# Patient Record
Sex: Male | Born: 1980 | Race: Black or African American | Hispanic: No | Marital: Married | Smoking: Never smoker
Health system: Southern US, Community
[De-identification: ages and names within clinical notes are randomized; demographics above are authoritative.]

## PROBLEM LIST (undated history)

## (undated) DIAGNOSIS — G43909 Migraine, unspecified, not intractable, without status migrainosus: Secondary | ICD-10-CM

---

## 2016-12-12 ENCOUNTER — Emergency Department (HOSPITAL_COMMUNITY)
Admission: EM | Admit: 2016-12-12 | Discharge: 2016-12-13 | Disposition: A | Discharge status: Home / Self Care | Payer: Self-pay | Attending: Physician Assistant | Admitting: Physician Assistant

## 2016-12-12 ENCOUNTER — Emergency Department (HOSPITAL_COMMUNITY): Payer: Self-pay

## 2016-12-12 ENCOUNTER — Encounter (HOSPITAL_COMMUNITY): Payer: Self-pay

## 2016-12-12 DIAGNOSIS — R404 Transient alteration of awareness: Secondary | ICD-10-CM | POA: Insufficient documentation

## 2016-12-12 DIAGNOSIS — Y999 Unspecified external cause status: Secondary | ICD-10-CM | POA: Insufficient documentation

## 2016-12-12 DIAGNOSIS — Z79899 Other long term (current) drug therapy: Secondary | ICD-10-CM | POA: Insufficient documentation

## 2016-12-12 DIAGNOSIS — Y929 Unspecified place or not applicable: Secondary | ICD-10-CM | POA: Insufficient documentation

## 2016-12-12 DIAGNOSIS — Z5181 Encounter for therapeutic drug level monitoring: Secondary | ICD-10-CM | POA: Insufficient documentation

## 2016-12-12 DIAGNOSIS — Y939 Activity, unspecified: Secondary | ICD-10-CM | POA: Insufficient documentation

## 2016-12-12 DIAGNOSIS — S01511A Laceration without foreign body of lip, initial encounter: Secondary | ICD-10-CM | POA: Insufficient documentation

## 2016-12-12 DIAGNOSIS — Z23 Encounter for immunization: Secondary | ICD-10-CM | POA: Insufficient documentation

## 2016-12-12 LAB — TYPE AND SCREEN
ABO/RH(D): O POS
Antibody Screen: NEGATIVE

## 2016-12-12 LAB — URINALYSIS, ROUTINE W REFLEX MICROSCOPIC
BACTERIA UA: NONE SEEN
Bilirubin Urine: NEGATIVE
GLUCOSE, UA: NEGATIVE mg/dL
Hgb urine dipstick: NEGATIVE
Ketones, ur: 5 mg/dL — AB
Leukocytes, UA: NEGATIVE
NITRITE: NEGATIVE
Protein, ur: 30 mg/dL — AB
SPECIFIC GRAVITY, URINE: 1.028 (ref 1.005–1.030)
pH: 8 (ref 5.0–8.0)

## 2016-12-12 LAB — CBC
HEMATOCRIT: 38.8 % — AB (ref 39.0–52.0)
Hemoglobin: 12.9 g/dL — ABNORMAL LOW (ref 13.0–17.0)
MCH: 26.4 pg (ref 26.0–34.0)
MCHC: 33.2 g/dL (ref 30.0–36.0)
MCV: 79.5 fL (ref 78.0–100.0)
PLATELETS: 225 10*3/uL (ref 150–400)
RBC: 4.88 MIL/uL (ref 4.22–5.81)
RDW: 13.5 % (ref 11.5–15.5)
WBC: 8.7 10*3/uL (ref 4.0–10.5)

## 2016-12-12 LAB — COMPREHENSIVE METABOLIC PANEL
ALBUMIN: 3.5 g/dL (ref 3.5–5.0)
ALK PHOS: 90 U/L (ref 38–126)
ALT: 18 U/L (ref 17–63)
AST: 22 U/L (ref 15–41)
Anion gap: 9 (ref 5–15)
BILIRUBIN TOTAL: 0.7 mg/dL (ref 0.3–1.2)
BUN: 8 mg/dL (ref 6–20)
CO2: 25 mmol/L (ref 22–32)
CREATININE: 0.92 mg/dL (ref 0.61–1.24)
Calcium: 8.8 mg/dL — ABNORMAL LOW (ref 8.9–10.3)
Chloride: 104 mmol/L (ref 101–111)
GFR calc Af Amer: >60 mL/min (ref >60)
GLUCOSE: 129 mg/dL — AB (ref 65–99)
POTASSIUM: 3.2 mmol/L — AB (ref 3.5–5.1)
Sodium: 138 mmol/L (ref 135–145)
TOTAL PROTEIN: 7.2 g/dL (ref 6.5–8.1)

## 2016-12-12 LAB — I-STAT CHEM 8, ED
BUN: 11 mg/dL (ref 6–20)
CHLORIDE: 103 mmol/L (ref 101–111)
CREATININE: 0.8 mg/dL (ref 0.61–1.24)
Calcium, Ion: 1.04 mmol/L — ABNORMAL LOW (ref 1.15–1.40)
Glucose, Bld: 134 mg/dL — ABNORMAL HIGH (ref 65–99)
HEMATOCRIT: 41 % (ref 39.0–52.0)
Hemoglobin: 13.9 g/dL (ref 13.0–17.0)
POTASSIUM: 3.1 mmol/L — AB (ref 3.5–5.1)
Sodium: 139 mmol/L (ref 135–145)
TCO2: 24 mmol/L (ref 0–100)

## 2016-12-12 LAB — PROTIME-INR
INR: 1.15
PROTHROMBIN TIME: 14.8 s (ref 11.4–15.2)

## 2016-12-12 LAB — I-STAT CG4 LACTIC ACID, ED: LACTIC ACID, VENOUS: 2.42 mmol/L — AB (ref 0.5–1.9)

## 2016-12-12 LAB — ETHANOL: Alcohol, Ethyl (B): <5 mg/dL (ref <5)

## 2016-12-12 LAB — ABO/RH: ABO/RH(D): O POS

## 2016-12-12 LAB — CDS SEROLOGY

## 2016-12-12 MED ORDER — TETANUS-DIPHTH-ACELL PERTUSSIS 5-2.5-18.5 LF-MCG/0.5 IM SUSP
0.5000 mL | Freq: Once | INTRAMUSCULAR | Status: AC
  Administered 2016-12-12: 0.5 mL via INTRAMUSCULAR
  Filled 2016-12-12: qty 0.5

## 2016-12-12 NOTE — ED Notes (Signed)
Pt transported to CT ?

## 2016-12-12 NOTE — ED Notes (Signed)
Pt returned from CT °
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
## 2016-12-12 NOTE — ED Notes (Signed)
Pt given water and sprite and drinking without difficulty.
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
## 2016-12-12 NOTE — ED Notes (Signed)
Pt verbalized understanding of d/c instructions and has no further questions. Pt stable, NAD, VSS, pt alert and oriented x 4 upon d/c. Pt removed all belongings from room.

## 2016-12-12 NOTE — ED Notes (Signed)
Pt now oriented x 4 and alert to voice. Pt's significant other at bedside with pt. Pt resting.

## 2016-12-12 NOTE — ED Notes (Signed)
Pt ambulated to the end of the hall and back without difficulty.
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
## 2017-01-04 ENCOUNTER — Emergency Department (HOSPITAL_COMMUNITY)
Admission: EM | Admit: 2017-01-04 | Discharge: 2017-01-04 | Disposition: A | Discharge status: Home / Self Care | Payer: Self-pay | Attending: Emergency Medicine | Admitting: Emergency Medicine

## 2017-01-04 DIAGNOSIS — M545 Low back pain: Secondary | ICD-10-CM | POA: Insufficient documentation

## 2017-01-04 DIAGNOSIS — Z5321 Procedure and treatment not carried out due to patient leaving prior to being seen by health care provider: Secondary | ICD-10-CM | POA: Insufficient documentation

## 2017-01-04 NOTE — ED Notes (Signed)
Pt not in lobby, assumed pt left

## 2017-01-04 NOTE — ED Triage Notes (Signed)
Pt c/o shooting pain on right lower back since earlier this week. Denies any recent hard physical exertion. Pain is worse when making certain movements.
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
## 2017-01-04 NOTE — ED Notes (Signed)
Pt not in lobby.  

## 2017-01-04 NOTE — ED Notes (Signed)
Pt called for room. No response, pt not seen in lobby.

## 2017-05-03 ENCOUNTER — Encounter (HOSPITAL_COMMUNITY): Payer: Self-pay | Admitting: *Deleted

## 2017-05-03 ENCOUNTER — Emergency Department (HOSPITAL_COMMUNITY)
Admission: EM | Admit: 2017-05-03 | Discharge: 2017-05-03 | Disposition: A | Discharge status: Home / Self Care | Payer: BC Managed Care – PPO | Attending: Emergency Medicine | Admitting: Emergency Medicine

## 2017-05-03 DIAGNOSIS — G43109 Migraine with aura, not intractable, without status migrainosus: Secondary | ICD-10-CM | POA: Diagnosis not present

## 2017-05-03 DIAGNOSIS — R51 Headache: Secondary | ICD-10-CM | POA: Diagnosis present

## 2017-05-03 HISTORY — DX: Migraine, unspecified, not intractable, without status migrainosus: G43.909

## 2017-05-03 MED ORDER — SODIUM CHLORIDE 0.9 % IV BOLUS (SEPSIS)
1000.0000 mL | Freq: Once | INTRAVENOUS | Status: AC
  Administered 2017-05-03: 1000 mL via INTRAVENOUS

## 2017-05-03 MED ORDER — DEXAMETHASONE SODIUM PHOSPHATE 10 MG/ML IJ SOLN
10.0000 mg | Freq: Once | INTRAMUSCULAR | Status: AC
  Administered 2017-05-03: 10 mg via INTRAVENOUS
  Filled 2017-05-03: qty 1

## 2017-05-03 MED ORDER — DIPHENHYDRAMINE HCL 50 MG/ML IJ SOLN
12.5000 mg | Freq: Once | INTRAMUSCULAR | Status: AC
  Administered 2017-05-03: 12.5 mg via INTRAVENOUS
  Filled 2017-05-03: qty 1

## 2017-05-03 MED ORDER — PROCHLORPERAZINE EDISYLATE 5 MG/ML IJ SOLN
10.0000 mg | Freq: Once | INTRAMUSCULAR | Status: AC
  Administered 2017-05-03: 10 mg via INTRAVENOUS
  Filled 2017-05-03: qty 2

## 2017-05-03 NOTE — Discharge Instructions (Signed)
Please read attached information regarding your condition. Take ibuprofen or Tylenol as needed for headaches. Return to ED for worsening headache, vision changes, numbness, trouble walking, head injury.

## 2017-05-03 NOTE — ED Triage Notes (Signed)
Patient is alert and oriented x4.  He is being seen for a migraine behind the left eye that he states started a week ago.  Patient states that he has a Hx of migraines but has not had one in a long time.  Currently he rates his pain 6 of 10.

## 2017-05-03 NOTE — ED Provider Notes (Signed)
[facility]-EMERGENCY DEPT Provider Note   CSN: [id number] Arrival date & time: 05/03/17  0946     History   Chief Complaint Chief Complaint  Patient presents with  . Migraine    HPI Gavin Young is a 36 y.o. male.  HPI  Patient, with past medical history of migraines, presents to ED for evaluation of migraine that began one week ago. He reports some improvement in symptoms with Tylenol and Excedrin. States that pain is 6/10 and located on the left side of his head. He has had 1 episode of vomiting with intermittent nausea. He denies any vision changes, head injuries, falls, abdominal pain, numbness, weakness, trouble walking, fevers or chills.  Past Medical History:  Diagnosis Date  . Migraines     There are no active problems to display for this patient.   History reviewed. No pertinent surgical history.     Home Medications    Prior to Admission medications   Medication Sig Start Date End Date Taking? Authorizing Provider  BEE POLLEN PO Take 1 capsule by mouth at bedtime.    [provider]  CALCIUM PO Take 1 tablet by mouth at bedtime.    [provider]  Cholecalciferol (VITAMIN D PO) Take 1 tablet by mouth at bedtime.    [provider]    Family History History reviewed. No pertinent family history.  Social History Social History  Substance Use Topics  . Smoking status: Never Smoker  . Smokeless tobacco: Never Used  . Alcohol use No     Allergies   Patient has no known allergies.   Review of Systems Review of Systems  Constitutional: Negative for appetite change, chills and fever.  HENT: Negative for ear pain, rhinorrhea, sneezing and sore throat.   Eyes: Negative for photophobia and visual disturbance.  Respiratory: Negative for cough, chest tightness, shortness of breath and wheezing.   Cardiovascular: Negative for chest pain and palpitations.  Gastrointestinal: Positive for nausea and vomiting. Negative for abdominal  pain, blood in stool, constipation and diarrhea.  Genitourinary: Negative for dysuria, hematuria and urgency.  Musculoskeletal: Negative for myalgias.  Skin: Negative for rash.  Neurological: Positive for headaches. Negative for dizziness, weakness, light-headedness and numbness.     Physical Exam Updated Vital Signs BP 135/87 (BP Location: Right Arm)   Pulse (!) 56   Temp 97.7 F (36.5 C) (Oral)   Resp 18   Ht 6\' 3"  (1.905 m)   Wt 97.5 kg (215 lb)   SpO2 100%   BMI 26.87 kg/m   Physical Exam  Constitutional: He is oriented to person, place, and time. He appears well-developed and well-nourished. No distress.  Nontoxic appearing and in no acute distress.  HENT:  Head: Normocephalic and atraumatic.  Nose: Nose normal.  Eyes: Conjunctivae and EOM are normal. Right eye exhibits no discharge. Left eye exhibits no discharge. No scleral icterus.  Neck: Normal range of motion. Neck supple.  Cardiovascular: Normal rate, regular rhythm, normal heart sounds and intact distal pulses.  Exam reveals no gallop and no friction rub.   No murmur heard. Pulmonary/Chest: Effort normal and breath sounds normal. No respiratory distress.  Abdominal: Soft. Bowel sounds are normal. He exhibits no distension. There is no tenderness. There is no guarding.  Musculoskeletal: Normal range of motion. He exhibits no edema.  Neurological: He is alert and oriented to person, place, and time. No cranial nerve deficit or sensory deficit. He exhibits normal muscle tone. Coordination normal.  Pupils reactive. No facial asymmetry  noted. Cranial nerves appear grossly intact. Sensation intact to light touch on face, BUE and BLE. Strength 5/5 in BUE and BLE.  Skin: Skin is warm and dry. No rash noted.  Psychiatric: He has a normal mood and affect.  Nursing note and vitals reviewed.    ED Treatments / Results  Labs (all labs ordered are listed, but only abnormal results are displayed) Labs Reviewed - No data to  display  EKG  EKG Interpretation None       Radiology No results found.  Procedures Procedures (including critical care time)  Medications Ordered in ED Medications  dexamethasone (DECADRON) injection 10 mg (not administered)  sodium chloride 0.9 % bolus 1,000 mL (1,000 mLs Intravenous New Bag/Given 05/03/17 1342)  prochlorperazine (COMPAZINE) injection 10 mg (10 mg Intravenous Given 05/03/17 1342)  diphenhydrAMINE (BENADRYL) injection 12.5 mg (12.5 mg Intravenous Given 05/03/17 1342)     Initial Impression / Assessment and Plan / ED Course  I have reviewed the triage vital signs and the nursing notes.  Pertinent labs & imaging results that were available during my care of the patient were reviewed by me and considered in my medical decision making (see chart for details).     Patient presents to ED for evaluation of migraine. He does have a history of migraines in the past. States this feels similar. He denies any head injury, loss of consciousness, falls, vision changes. He has had some relief with Tylenol and Excedrin. There are no focal deficits on neurological exam. No head imaging warranted at this time, due to a low suspicion for acute intracranial abnormality or hemorrhage being the cause of his headache based on physical exam findings and history. Patient reports complete resolution of symptoms with Compazine, Benadryl and fluids. We'll give Decadron to prevent rebound. Patient ambulated throughout ED. High blood pressure improved in ED on recheck and medications. Encouraged patient to continue home medications as previously prescribed and to take Tylenol and Motrin as needed for headaches. Patient appears stable for discharge at this time. Strict return precautions given.  Final Clinical Impressions(s) / ED Diagnoses   Final diagnoses:  Migraine with aura and without status migrainosus, not intractable    New Prescriptions New Prescriptions   No medications on file       Dietrich PatesKhatri, Terran Klinke, PA-C 05/03/17 1518    Tegeler, Canary Brimhristopher J, MD 05/04/17 1447

## 2017-05-15 ENCOUNTER — Ambulatory Visit: Payer: BC Managed Care – PPO | Admitting: Neurology

## 2017-05-18 ENCOUNTER — Encounter: Payer: Self-pay | Admitting: Neurology

## 2017-05-18 ENCOUNTER — Ambulatory Visit (INDEPENDENT_AMBULATORY_CARE_PROVIDER_SITE_OTHER): Payer: BC Managed Care – PPO | Admitting: Neurology

## 2017-05-18 DIAGNOSIS — G4489 Other headache syndrome: Secondary | ICD-10-CM | POA: Diagnosis not present

## 2017-05-18 DIAGNOSIS — H532 Diplopia: Secondary | ICD-10-CM

## 2017-05-18 MED ORDER — METHYLPREDNISOLONE 4 MG PO TABS: ORAL_TABLET | ORAL | 0 refills | Status: DC

## 2017-05-18 MED ORDER — MELOXICAM 15 MG PO TABS: 15.0000 mg | ORAL_TABLET | Freq: Every day | ORAL | 1 refills | Status: DC

## 2017-05-18 NOTE — Progress Notes (Signed)
[facility]  PATIENT: Gavin Young DOB: [birth year]  REFERRING DOCTOR OR PCP:   SOURCE: Patient and notes form Dr. Maryjane Hurter  _________________________________   HISTORICAL  CHIEF COMPLAINT:  Chief Complaint  Patient presents with  . Headache    KB is here for eval of h/a's onset 2 wks. ago, mainly left sided with blurry vision left eye.  Has seen oppthalmologist and had nml. eye exam with exception of slight stigmatism.  Minimal relief with Excedrin Migraine but it caused n/v.  Slight h/a today, 3 on 1-10 pain scale/fim    HISTORY OF PRESENT ILLNESS:  I had the pleasure seeing you patient, Gavin Young for neurologic consultation regarding his headaches.  He is a 36 year old man who began to experience headaches about 2 weeks ago. The headaches are mostly on the left side in the temple but will sometimes be on the right side in the temple. Additionally, he has some occipital pain, left greater than right. He describes the quality of the pain as aching. There is some photophobia and nausea and that intensifies when the headache intensifies.He also has had diplopia that is most noticeable when the headache is more severe.    Moving does not make the headache worse when it is mild but will make the headache worse when it is more severe. He is noting some visual scintillations to the left.   The headache slightly response to over-the-counter medication like Tylenol. Excedrin Migraine made him feel sick. However, any benefit is short-lived with these medications.       The headache intensified and he went to the emergency room on 05/03/2017. He was given Compazine, Benadryl and IV fluids with resolution of the symptoms. Decadron was also provided. The headache was much better for about 2 days and then started to return. A few days later he sore Dr. Truman Hayward for an eye exam. The exam was normal. Specifically, there was no papilledema or retinal changes.     About 20 years ago, he  did experience a period of time when he was having more headaches. These eventually resolved and he has only had intermittent headaches since.  He denies any numbness, weakness or clumsiness.    REVIEW OF SYSTEMS: Constitutional: No fevers, chills, sweats, or change in appetite Eyes: No visual changes, double vision, eye pain Ear, nose and throat: No hearing loss, ear pain, nasal congestion, sore throat Cardiovascular: No chest pain, palpitations Respiratory: No shortness of breath at rest or with exertion.   No wheezes GastrointestinaI: No nausea, vomiting, diarrhea, abdominal pain, fecal incontinence Genitourinary: No dysuria, urinary retention or frequency.  No nocturia. Musculoskeletal: No neck pain, back pain Integumentary: No rash, pruritus, skin lesions Neurological: as above Psychiatric: No depression at this time.  No anxiety Endocrine: No palpitations, diaphoresis, change in appetite, change in weigh or increased thirst Hematologic/Lymphatic: No anemia, purpura, petechiae. Allergic/Immunologic: No itchy/runny eyes, nasal congestion, recent allergic reactions, rashes  ALLERGIES: No Known Allergies  HOME MEDICATIONS:  Current Outpatient Prescriptions:  .  BEE POLLEN PO, Take 1 capsule by mouth at bedtime., Disp: , Rfl:  .  CALCIUM PO, Take 1 tablet by mouth at bedtime., Disp: , Rfl:  .  Cholecalciferol (VITAMIN D PO), Take 1 tablet by mouth at bedtime., Disp: , Rfl:  .  meloxicam (MOBIC) 15 MG tablet, Take 1 tablet (15 mg total) by mouth daily., Disp: 30 tablet, Rfl: 1 .  methylPREDNISolone (MEDROL) 4 MG tablet, Take over 6 days as directed starting with  6 pills the first day, Disp: 21 tablet, Rfl: 0  PAST MEDICAL HISTORY: Past Medical History:  Diagnosis Date  . Migraines     PAST SURGICAL HISTORY: No past surgical history on file.  FAMILY HISTORY: Family History  Problem Relation Age of Onset  . Arthritis Mother   . Healthy Sister   . Healthy Brother      SOCIAL HISTORY:  Social History   Social History  . Marital status: Married    Spouse name: N/A  . Number of children: N/A  . Years of education: N/A   Occupational History  . Not on file.   Social History Main Topics  . Smoking status: Never Smoker  . Smokeless tobacco: Never Used  . Alcohol use No  . Drug use: No  . Sexual activity: Not on file   Other Topics Concern  . Not on file   Social History Narrative  . No narrative on file     PHYSICAL EXAM  Vitals:   05/18/17 0947  BP: 129/84  Pulse: 68  Resp: 14  Weight: 224 lb 8 oz (101.8 kg)  Height: 6' 3"  (1.905 m)    Body mass index is 28.06 kg/m.   General: The patient is well-developed and well-nourished and in no acute distress  Eyes:  Funduscopic exam shows normal optic discs and retinal vessels.  Neck: The neck is supple, no carotid bruits are noted.  The neck is nontender.  Cardiovascular: The heart has a regular rate and rhythm with a normal S1 and S2. There were no murmurs, gallops or rubs. Lungs are clear to auscultation.  Skin: Extremities are without significant edema.  Musculoskeletal:  Back is nontender  Neurologic Exam  Mental status: The patient is alert and oriented x 3 at the time of the examination. The patient has apparent normal recent and remote memory, with an apparently normal attention span and concentration ability.   Speech is normal.  Cranial nerves: Extraocular movements are full. Pupils are equal, round, and reactive to light and accomodation.  Visual fields are full.  Facial symmetry is present. There is good facial sensation to soft touch bilaterally.Facial strength is normal.  Trapezius and sternocleidomastoid strength is normal. No dysarthria is noted.  The tongue is midline, and the patient has symmetric elevation of the soft palate. No obvious hearing deficits are noted.  Motor:  Muscle bulk is normal.   Tone is normal. Strength is  5 / 5 in all 4 extremities.    Sensory: Sensory testing is intact to pinprick, soft touch and vibration sensation in all 4 extremities.  Coordination: Cerebellar testing reveals good finger-nose-finger and heel-to-shin bilaterally.  Gait and station: Station is normal.   Gait is normal. Tandem gait is normal. Romberg is negative.   Reflexes: Deep tendon reflexes are symmetric and normal bilaterally.       DIAGNOSTIC DATA (LABS, IMAGING, TESTING) - I reviewed patient records, labs, notes, testing and imaging myself where available.  Lab Results  Component Value Date   WBC 8.7 12/12/2016   HGB 13.9 12/12/2016   HCT 41.0 12/12/2016   MCV 79.5 12/12/2016   PLT 225 12/12/2016      Component Value Date/Time   NA 139 12/12/2016 2042   K 3.1 (L) 12/12/2016 2042   CL 103 12/12/2016 2042   CO2 25 12/12/2016 2036   GLUCOSE 134 (H) 12/12/2016 2042   BUN 11 12/12/2016 2042   CREATININE 0.80 12/12/2016 2042   CALCIUM 8.8 (L) 12/12/2016 2036  PROT 7.2 12/12/2016 2036   ALBUMIN 3.5 12/12/2016 2036   AST 22 12/12/2016 2036   ALT 18 12/12/2016 2036   ALKPHOS 90 12/12/2016 2036   BILITOT 0.7 12/12/2016 2036   GFRNONAA >60 12/12/2016 2036   GFRAA >60 12/12/2016 2036       ASSESSMENT AND PLAN  Other headache syndrome - Plan: Sedimentation rate, C-reactive protein, MR BRAIN W WO CONTRAST  Diplopia - Plan: MR BRAIN W WO CONTRAST     In summary, Mr. Meleski is a 36 year old man who has had 2-3 weeks of headaches and also has intermittent diplopia. The intra-ocular exam is normal.   However, due to the presence of diplopia we need to check an MRI of the brain with and without contrast. I will also check an ESR and CRP to rule out temporal arteritis.  To help with the pain, he received 60 mg of IM Toradol today. I will have him take a 6 day Medrol dosepak and start an anti-inflammatory (meloxicam 15 mg). Hopefully these interventions will help. We will call him after the MRI to go over the results and get a status  report.  Consider Topamax or a tricyclic if the pain persists and the MRI is normal.     I will see him for a scheduled visit in one month.  He should call us sooner if he notes new neurologic symptoms or worsening headache.   Thank you for asking me to see Mr. Gunderson for a neurologic consultation. Please let me know if I can be of further assistance with him or other patients in the future.  Dysen Edmondson A. Felecia Shelling, MD, [facility] 8/89/1694, 50:38 AM Certified in Neurology, Clinical Neurophysiology, Sleep Medicine, Pain Medicine and Neuroimaging  [facility] [address] [place], [place] [address] [phone]

## 2017-05-19 ENCOUNTER — Telehealth: Payer: Self-pay | Admitting: *Deleted

## 2017-05-19 LAB — C-REACTIVE PROTEIN: CRP: 12.8 mg/L — ABNORMAL HIGH (ref 0.0–4.9)

## 2017-05-19 LAB — SEDIMENTATION RATE: SED RATE: 5 mm/h (ref 0–15)

## 2017-05-19 NOTE — Telephone Encounter (Signed)
LMOM that per RAS, lab work done in our office is fine.  will call with MRI results once it is done.  He does not need to return this call unless he has questions/fim

## 2017-05-19 NOTE — Telephone Encounter (Signed)
-----   Message from Asa Lente, MD sent at 05/19/2017  1:14 PM EDT ----- Please let the patient know that the lab work is fine.   We will let him know what the results of the MRI are

## 2017-05-30 ENCOUNTER — Emergency Department (HOSPITAL_COMMUNITY)
Admission: EM | Admit: 2017-05-30 | Discharge: 2017-05-30 | Disposition: A | Discharge status: Home / Self Care | Payer: BC Managed Care – PPO | Attending: Emergency Medicine | Admitting: Emergency Medicine

## 2017-05-30 ENCOUNTER — Ambulatory Visit
Admission: RE | Admit: 2017-05-30 | Discharge: 2017-05-30 | Disposition: A | Discharge status: Home / Self Care | Payer: BC Managed Care – PPO | Source: Ambulatory Visit | Attending: Neurology | Admitting: Neurology

## 2017-05-30 DIAGNOSIS — I6200 Nontraumatic subdural hemorrhage, unspecified: Secondary | ICD-10-CM | POA: Insufficient documentation

## 2017-05-30 DIAGNOSIS — S065X9A Traumatic subdural hemorrhage with loss of consciousness of unspecified duration, initial encounter: Secondary | ICD-10-CM

## 2017-05-30 DIAGNOSIS — G4489 Other headache syndrome: Secondary | ICD-10-CM

## 2017-05-30 DIAGNOSIS — H532 Diplopia: Secondary | ICD-10-CM

## 2017-05-30 DIAGNOSIS — S065XAA Traumatic subdural hemorrhage with loss of consciousness status unknown, initial encounter: Secondary | ICD-10-CM

## 2017-05-30 LAB — CBC WITH DIFFERENTIAL/PLATELET
BASOS ABS: 0 10*3/uL (ref 0.0–0.1)
BASOS PCT: 0 %
EOS ABS: 0.2 10*3/uL (ref 0.0–0.7)
Eosinophils Relative: 2 %
HCT: 42 % (ref 39.0–52.0)
HEMOGLOBIN: 13.9 g/dL (ref 13.0–17.0)
Lymphocytes Relative: 33 %
Lymphs Abs: 2.5 10*3/uL (ref 0.7–4.0)
MCH: 26.7 pg (ref 26.0–34.0)
MCHC: 33.1 g/dL (ref 30.0–36.0)
MCV: 80.8 fL (ref 78.0–100.0)
Monocytes Absolute: 0.5 10*3/uL (ref 0.1–1.0)
Monocytes Relative: 7 %
NEUTROS PCT: 58 %
Neutro Abs: 4.3 10*3/uL (ref 1.7–7.7)
Platelets: 200 10*3/uL (ref 150–400)
RBC: 5.2 MIL/uL (ref 4.22–5.81)
RDW: 14.6 % (ref 11.5–15.5)
WBC: 7.5 10*3/uL (ref 4.0–10.5)

## 2017-05-30 LAB — BASIC METABOLIC PANEL
Anion gap: 7 (ref 5–15)
BUN: 14 mg/dL (ref 6–20)
CALCIUM: 9 mg/dL (ref 8.9–10.3)
CO2: 27 mmol/L (ref 22–32)
CREATININE: 0.93 mg/dL (ref 0.61–1.24)
Chloride: 104 mmol/L (ref 101–111)
GFR calc non Af Amer: >60 mL/min (ref >60)
Glucose, Bld: 107 mg/dL — ABNORMAL HIGH (ref 65–99)
Potassium: 4.1 mmol/L (ref 3.5–5.1)
SODIUM: 138 mmol/L (ref 135–145)

## 2017-05-30 LAB — PROTIME-INR
INR: 1.11
Prothrombin Time: 14.2 seconds (ref 11.4–15.2)

## 2017-05-30 MED ORDER — GADOBENATE DIMEGLUMINE 529 MG/ML IV SOLN: 20.0000 mL | Freq: Once | INTRAVENOUS | Status: DC | PRN

## 2017-05-30 MED ORDER — LEVETIRACETAM 500 MG PO TABS: 500.0000 mg | ORAL_TABLET | Freq: Two times a day (BID) | ORAL | 0 refills | Status: DC

## 2017-05-30 MED ORDER — LEVETIRACETAM 500 MG PO TABS
1000.0000 mg | ORAL_TABLET | Freq: Once | ORAL | Status: AC
  Administered 2017-05-30: 1000 mg via ORAL
  Filled 2017-05-30: qty 2

## 2017-05-30 NOTE — ED Provider Notes (Signed)
[facility]-EMERGENCY DEPT Provider Note   CSN: [id number] Arrival date & time: 05/30/17  1738     History   Chief Complaint Chief Complaint  Patient presents with  . Migraine    HPI Gavin Young is a 36 y.o. male.  HPI 36 year old male who presents to the emergency department for 1 month of recurrent headaches who was following up with neurology who ordered an MRI to assess because of his headaches which revealed a subdural hematoma. Patient reported fall in April. He is unsure of the circumstances surrounding the incident but states that he was assessed during that time and obtained a CT scan of the head which was negative for any intracranial bleeding. He reported that his headache started 4 months after the fall. Denied any additional head trauma. Denied any history of bleeding disorders or anticoagulation. Reports that he has been taking different kinds of over-the-counter medication including Tylenol and Excedrin after the headaches began. He is endorsing intermittent diplopia after exerting himself which quickly resolves after rest. Denies any current headache and states that he has not had a headache for approximately 2 weeks. Denies any focal weaknesses, other visual disturbances, difficulty speaking or swallowing.  There is no alleviating or aggravating factors. Denies any other physical complaints. Past Medical History:  Diagnosis Date  . Migraines     Patient Active Problem List   Diagnosis Date Noted  . Other headache syndrome 05/18/2017  . Diplopia 05/18/2017    No past surgical history on file.     Home Medications    Prior to Admission medications   Medication Sig Start Date End Date Taking? Authorizing Provider  BEE POLLEN PO Take 1 capsule by mouth at bedtime.    [provider]  CALCIUM PO Take 1 tablet by mouth at bedtime.    [provider]  Cholecalciferol (VITAMIN D PO) Take 1 tablet by mouth at bedtime.    [provider]    levETIRAcetam (KEPPRA) 500 MG tablet Take 1 tablet (500 mg total) by mouth 2 (two) times daily. 05/30/17   Costella, Darci Current, PA-C  meloxicam (MOBIC) 15 MG tablet Take 1 tablet (15 mg total) by mouth daily. 05/18/17   Sater, Pearletha Furl, MD  methylPREDNISolone (MEDROL) 4 MG tablet Take over 6 days as directed starting with 6 pills the first day 05/18/17   Asa Lente, MD    Family History Family History  Problem Relation Age of Onset  . Arthritis Mother   . Healthy Sister   . Healthy Brother     Social History Social History  Substance Use Topics  . Smoking status: Never Smoker  . Smokeless tobacco: Never Used  . Alcohol use No     Allergies   Patient has no known allergies.   Review of Systems Review of Systems All other systems are reviewed and are negative for acute change except as noted in the HPI   Physical Exam Updated Vital Signs BP (!) 131/92   Pulse 71   Temp 98.2 F (36.8 C) (Oral)   Resp 14   SpO2 100%   Physical Exam  Constitutional: He is oriented to person, place, and time. He appears well-developed and well-nourished. No distress.  HENT:  Head: Normocephalic and atraumatic.  Nose: Nose normal.  Eyes: Pupils are equal, round, and reactive to light. Conjunctivae and EOM are normal. Right eye exhibits no discharge. Left eye exhibits no discharge. No scleral icterus.  Neck: Normal range of motion. Neck supple.  Cardiovascular:  Normal rate and regular rhythm.  Exam reveals no gallop and no friction rub.   No murmur heard. Pulmonary/Chest: Effort normal and breath sounds normal. No stridor. No respiratory distress. He has no rales.  Abdominal: Soft. He exhibits no distension. There is no tenderness.  Musculoskeletal: He exhibits no edema or tenderness.  Neurological: He is alert and oriented to person, place, and time.  Mental Status: Alert and oriented to person, place, and time. Attention and concentration normal. Speech clear. Recent memory is  intact  Cranial Nerves  II Visual Fields: Intact to confrontation. Visual fields intact. III, IV, VI: Pupils equal and reactive to light and near. Full eye movement without nystagmus  V Facial Sensation: Normal. No weakness of masticatory muscles  VII: No facial weakness or asymmetry  VIII Auditory Acuity: Grossly normal  IX/X: The uvula is midline; the palate elevates symmetrically  XI: Normal sternocleidomastoid and trapezius strength  XII: The tongue is midline. No atrophy or fasciculations.   Motor System: Muscle Strength: 5/5 and symmetric in the upper and lower extremities. No pronation or drift.  Muscle Tone: Tone and muscle bulk are normal in the upper and lower extremities.   Reflexes: DTRs: 1+ and symmetrical in all four extremities. Plantar responses are flexor bilaterally.  Coordination: Intact finger-to-nose, heel-to-shin. No tremor.  Sensation: Intact to light touch, and pinprick.  Gait: Routine gait normal.    Skin: Skin is warm and dry. No rash noted. He is not diaphoretic. No erythema.  Psychiatric: He has a normal mood and affect.  Vitals reviewed.    ED Treatments / Results  Labs (all labs ordered are listed, but only abnormal results are displayed) Labs Reviewed  BASIC METABOLIC PANEL - Abnormal; Notable for the following:       Result Value   Glucose, Bld 107 (*)    All other components within normal limits  CBC WITH DIFFERENTIAL/PLATELET  PROTIME-INR    EKG  EKG Interpretation  Date/Time:  Saturday May 30 2017 18:49:27 EDT Ventricular Rate:  62 PR Interval:    QRS Duration: 98 QT Interval:  416 QTC Calculation: 423 R Axis:   94 Text Interpretation:  Sinus rhythm Borderline right axis deviation ST elev, probable normal early repol pattern No old tracing to compare Confirmed by Drema Pry [phone]) on 05/30/2017 6:57:06 PM       Radiology Mr Laqueta Jean Wo Contrast  Result Date: 05/30/2017 CLINICAL DATA:  Initial evaluation for double  vision with headaches since September of 2018. EXAM: MRI HEAD WITHOUT AND WITH CONTRAST TECHNIQUE: Multiplanar, multiecho pulse sequences of the brain and surrounding structures were obtained without and with intravenous contrast. CONTRAST:  20 cc of MultiHance. COMPARISON:  None available. FINDINGS: Brain: There is a large cresenteric heterogeneous extra-axial collection overlying the left cerebral convexity, consistent with subdural hematoma. This measures up to 19 mm in diameter at the level of the left frontal lobe and approximately 27 mm at the level of the left parietal lobe. This collection demonstrates scattered internal areas of heterogeneous T1 and T2 signal intensity in, and is predominantly subacute in appearance with probable developing loculated components at its anterior and posterior aspects. Small amount of superimposed acute blood products would be difficult to exclude. Mass effect on the subjacent left cerebral hemisphere with partial attenuation of the left lateral ventricle. Associated left-to-right midline shift of 5 mm. No hydrocephalus or ventricular trapping. Basilar cisterns remain patent. Underlying cerebral volume is normal. Few tiny patchy T2/FLAIR hyperintense foci noted within the  deep white matter both cerebral hemispheres, nonspecific, but felt to be within normal limits for age. No evidence for acute or subacute infarct. Gray-white matter differentiation otherwise maintained. No other evidence for chronic intracranial hemorrhage. No mass lesion identified. No abnormal enhancement. Incidental note made of a small DVA at the anterior right frontal lobe. Incidental note made of a partially empty sella. Suprasellar region normal. Midline structures intact and normal. Vascular: Major intracranial vascular flow voids are maintained. Skull and upper cervical spine: Craniocervical junction within normal limits. Upper cervical spine normal. Bone marrow signal intensity within normal limits.  No appreciable scalp soft tissue abnormality. Sinuses/Orbits: Minimal bulging of the optic discs noted at the posterior aspects of the globes, slightly more prominent on the left, suspected to be related to the subdural. Globes and orbital soft tissues otherwise unremarkable. Paranasal sinuses are largely clear. No mastoid effusion. Inner ear structures normal. Other: None. IMPRESSION: Predominantly subacute appearing left subdural hematoma as above with associated mass effect on the subjacent left cerebral hemisphere and 5 mm of left-to-right shift. No hydrocephalus or ventricular trapping. Findings were discussed by telephone with the emergency room physician Dr. Eudelia Bunch at approximately 7:10 p.m. on 05/30/2017. Please note that this scan was performed as a routine study at an outpatient imaging center earlier this evening. Upon discovery of the intracranial hemorrhage, attempts were made to reach an on-call covering physician at the time of the exam. Subsequently, the decision was made by Dr. Phill Myron to emergently transport the patient to the emergency room via ambulance for continual monitoring and further care. Electronically Signed   By: Rise Mu M.D.   On: 05/30/2017 19:32    Procedures Procedures (including critical care time)  Medications Ordered in ED Medications  levETIRAcetam (KEPPRA) tablet 1,000 mg (not administered)     Initial Impression / Assessment and Plan / ED Course  I have reviewed the triage vital signs and the nursing notes.  Pertinent labs & imaging results that were available during my care of the patient were reviewed by me and considered in my medical decision making (see chart for details).     Subacute subdural. No focal deficits on exam. Case discussed with neurosurgery who evaluated the patient in the emergency department and cleared him for discharge with close follow-up for repeat imaging. Patient given initial dose of Keppra in the emergency  department for seizure prophylaxis.   The patient is safe for discharge with strict return precautions.   Final Clinical Impressions(s) / ED Diagnoses   Final diagnoses:  Subdural hematoma (HCC)   Disposition: Discharge  Condition: Good  I have discussed the results, Dx and Tx plan with the patient who expressed understanding and agree(s) with the plan. Discharge instructions discussed at great length. The patient was given strict return precautions who verbalized understanding of the instructions. No further questions at time of discharge.    Current Discharge Medication List    START taking these medications   Details  levETIRAcetam (KEPPRA) 500 MG tablet Take 1 tablet (500 mg total) by mouth 2 (two) times daily. Qty: 14 tablet, Refills: 0        Follow Up: Ditty, Loura Halt, MD [address] [place] [address] [phone]  Schedule an appointment as soon as possible for a visit  For close follow up to assess for subdural hematoma      Cardama, Amadeo Garnet, MD 05/30/17 2021
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
## 2017-05-30 NOTE — ED Notes (Signed)
Pt and significant other verbalized understanding of discharge instructions.

## 2017-05-30 NOTE — ED Notes (Signed)
ED Provider at bedside. 

## 2017-05-30 NOTE — ED Triage Notes (Signed)
Pt arrived via [facility] from [place] imaging center. Pt was being seen by imaging center for recurrent migraines when scan showed a subdural hematoma with shift, according to EMS. No paperwork sent by imaging center. Pt states he has had headaches for several months without relief. Pt is not currently c/o of pain but is tearful and anxious about the information relayed by the imaging center. Pt is alert and oriented x4.

## 2017-06-01 ENCOUNTER — Telehealth: Payer: Self-pay | Admitting: Neurology

## 2017-06-01 NOTE — Telephone Encounter (Signed)
I got call from [facility] imaging, MRI of the brain on May 30 2017, there was evidence of subacute left subdural hematoma, patient was sent to emergency room.  Predominantly subacute appearing left subdural hematoma as above with associated mass effect on the subjacent left cerebral hemisphere and 5 mm of left-to-right shift. No hydrocephalus or ventricular trapping.  Findings were discussed by telephone with the emergency room physician Dr. Eudelia Bunch at approximately 7:10 p.m. on 05/30/2017. Please note that this scan was performed as a routine study at an outpatient imaging center earlier this evening. Upon discovery of the intracranial hemorrhage, attempts were made to reach an on-call covering physician at the time of the exam. Subsequently, the decision was made by Dr. Phill Myron to emergently transport the patient to the emergency room via ambulance for continual monitoring and further care.

## 2017-06-08 ENCOUNTER — Telehealth: Payer: Self-pay | Admitting: Neurology

## 2017-06-08 NOTE — Telephone Encounter (Signed)
I called and left a message. We want to check in on him to make sure he is doing okay and to make sure that he follow-ups with neurosurgery. I will try to call him again tomorrow

## 2017-06-09 ENCOUNTER — Other Ambulatory Visit: Payer: Self-pay | Admitting: Neurosurgery

## 2017-06-09 DIAGNOSIS — S065X9A Traumatic subdural hemorrhage with loss of consciousness of unspecified duration, initial encounter: Secondary | ICD-10-CM

## 2017-06-09 DIAGNOSIS — S065XAA Traumatic subdural hemorrhage with loss of consciousness status unknown, initial encounter: Secondary | ICD-10-CM

## 2017-06-22 ENCOUNTER — Ambulatory Visit
Admission: RE | Admit: 2017-06-22 | Discharge: 2017-06-22 | Disposition: A | Discharge status: Home / Self Care | Payer: BC Managed Care – PPO | Source: Ambulatory Visit | Attending: Neurosurgery | Admitting: Neurosurgery

## 2017-06-22 DIAGNOSIS — S065X9A Traumatic subdural hemorrhage with loss of consciousness of unspecified duration, initial encounter: Secondary | ICD-10-CM

## 2017-06-22 DIAGNOSIS — S065XAA Traumatic subdural hemorrhage with loss of consciousness status unknown, initial encounter: Secondary | ICD-10-CM

## 2017-06-22 NOTE — Telephone Encounter (Signed)
He has seen neurosurgery and had a repeat CT scan today showing mild reduction in the size of the subdural.
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
## 2022-09-03 ENCOUNTER — Telehealth: Payer: Self-pay

## 2022-09-03 NOTE — Telephone Encounter (Signed)
No working #. AS, CMA 

## 2023-07-29 ENCOUNTER — Emergency Department (HOSPITAL_COMMUNITY): Payer: Commercial Managed Care - HMO

## 2023-07-29 ENCOUNTER — Encounter (HOSPITAL_COMMUNITY)
Admission: EM | Disposition: A | Discharge status: Home / Self Care | Payer: Self-pay | Source: Home / Self Care | Attending: Emergency Medicine

## 2023-07-29 ENCOUNTER — Emergency Department (HOSPITAL_COMMUNITY): Payer: Commercial Managed Care - HMO | Admitting: Anesthesiology

## 2023-07-29 ENCOUNTER — Other Ambulatory Visit: Payer: Self-pay

## 2023-07-29 ENCOUNTER — Emergency Department (EMERGENCY_DEPARTMENT_HOSPITAL): Payer: Commercial Managed Care - HMO | Admitting: Anesthesiology

## 2023-07-29 ENCOUNTER — Ambulatory Visit (HOSPITAL_COMMUNITY)
Admission: EM | Admit: 2023-07-29 | Discharge: 2023-07-29 | Disposition: A | Discharge status: Home / Self Care | Payer: Commercial Managed Care - HMO | Attending: Emergency Medicine | Admitting: Emergency Medicine

## 2023-07-29 ENCOUNTER — Encounter (HOSPITAL_COMMUNITY): Payer: Self-pay | Admitting: General Surgery

## 2023-07-29 DIAGNOSIS — K8012 Calculus of gallbladder with acute and chronic cholecystitis without obstruction: Secondary | ICD-10-CM | POA: Diagnosis not present

## 2023-07-29 DIAGNOSIS — K819 Cholecystitis, unspecified: Secondary | ICD-10-CM

## 2023-07-29 DIAGNOSIS — E872 Acidosis, unspecified: Secondary | ICD-10-CM | POA: Diagnosis not present

## 2023-07-29 DIAGNOSIS — K810 Acute cholecystitis: Secondary | ICD-10-CM

## 2023-07-29 DIAGNOSIS — K7460 Unspecified cirrhosis of liver: Secondary | ICD-10-CM | POA: Diagnosis not present

## 2023-07-29 DIAGNOSIS — K429 Umbilical hernia without obstruction or gangrene: Secondary | ICD-10-CM | POA: Diagnosis not present

## 2023-07-29 DIAGNOSIS — N433 Hydrocele, unspecified: Secondary | ICD-10-CM | POA: Insufficient documentation

## 2023-07-29 DIAGNOSIS — I517 Cardiomegaly: Secondary | ICD-10-CM | POA: Insufficient documentation

## 2023-07-29 DIAGNOSIS — K5730 Diverticulosis of large intestine without perforation or abscess without bleeding: Secondary | ICD-10-CM | POA: Insufficient documentation

## 2023-07-29 DIAGNOSIS — G43909 Migraine, unspecified, not intractable, without status migrainosus: Secondary | ICD-10-CM | POA: Insufficient documentation

## 2023-07-29 HISTORY — PX: CHOLECYSTECTOMY: SHX55

## 2023-07-29 LAB — BETA-HYDROXYBUTYRIC ACID: Beta-Hydroxybutyric Acid: 0.07 mmol/L (ref 0.05–0.27)

## 2023-07-29 LAB — COMPREHENSIVE METABOLIC PANEL
ALT: 30 U/L (ref 0–44)
AST: 24 U/L (ref 15–41)
Albumin: 3.5 g/dL (ref 3.5–5.0)
Alkaline Phosphatase: 109 U/L (ref 38–126)
Anion gap: 14 (ref 5–15)
BUN: 12 mg/dL (ref 6–20)
CO2: 20 mmol/L — ABNORMAL LOW (ref 22–32)
Calcium: 8.9 mg/dL (ref 8.9–10.3)
Chloride: 106 mmol/L (ref 98–111)
Creatinine, Ser: 1 mg/dL (ref 0.61–1.24)
GFR, Estimated: >60 mL/min (ref >60)
Glucose, Bld: 140 mg/dL — ABNORMAL HIGH (ref 70–99)
Potassium: 2.8 mmol/L — ABNORMAL LOW (ref 3.5–5.1)
Sodium: 140 mmol/L (ref 135–145)
Total Bilirubin: 0.4 mg/dL (ref <1.2)
Total Protein: 7.8 g/dL (ref 6.5–8.1)

## 2023-07-29 LAB — I-STAT VENOUS BLOOD GAS, ED
Acid-Base Excess: 0 mmol/L (ref 0.0–2.0)
Acid-Base Excess: 0 mmol/L (ref 0.0–2.0)
Bicarbonate: 22.9 mmol/L (ref 20.0–28.0)
Bicarbonate: 22.8 mmol/L (ref 20.0–28.0)
Calcium, Ion: 1.1 mmol/L — ABNORMAL LOW (ref 1.15–1.40)
Calcium, Ion: 1.14 mmol/L — ABNORMAL LOW (ref 1.15–1.40)
HCT: 43 % (ref 39.0–52.0)
HCT: 41 % (ref 39.0–52.0)
Hemoglobin: 14.6 g/dL (ref 13.0–17.0)
Hemoglobin: 13.9 g/dL (ref 13.0–17.0)
O2 Saturation: 76 %
O2 Saturation: 84 %
Potassium: 2.8 mmol/L — ABNORMAL LOW (ref 3.5–5.1)
Potassium: 3.3 mmol/L — ABNORMAL LOW (ref 3.5–5.1)
Sodium: 142 mmol/L (ref 135–145)
Sodium: 142 mmol/L (ref 135–145)
TCO2: 24 mmol/L (ref 22–32)
TCO2: 24 mmol/L (ref 22–32)
pCO2, Ven: 31.1 mm[Hg] — ABNORMAL LOW (ref 44–60)
pCO2, Ven: 29.6 mm[Hg] — ABNORMAL LOW (ref 44–60)
pH, Ven: 7.475 — ABNORMAL HIGH (ref 7.25–7.43)
pH, Ven: 7.495 — ABNORMAL HIGH (ref 7.25–7.43)
pO2, Ven: 37 mm[Hg] (ref 32–45)
pO2, Ven: 44 mm[Hg] (ref 32–45)

## 2023-07-29 LAB — I-STAT CHEM 8, ED
BUN: 12 mg/dL (ref 6–20)
BUN: 11 mg/dL (ref 6–20)
Calcium, Ion: 1.09 mmol/L — ABNORMAL LOW (ref 1.15–1.40)
Calcium, Ion: 1.14 mmol/L — ABNORMAL LOW (ref 1.15–1.40)
Chloride: 103 mmol/L (ref 98–111)
Chloride: 104 mmol/L (ref 98–111)
Creatinine, Ser: 0.9 mg/dL (ref 0.61–1.24)
Creatinine, Ser: 0.8 mg/dL (ref 0.61–1.24)
Glucose, Bld: 139 mg/dL — ABNORMAL HIGH (ref 70–99)
Glucose, Bld: 116 mg/dL — ABNORMAL HIGH (ref 70–99)
HCT: 44 % (ref 39.0–52.0)
HCT: 42 % (ref 39.0–52.0)
Hemoglobin: 15 g/dL (ref 13.0–17.0)
Hemoglobin: 14.3 g/dL (ref 13.0–17.0)
Potassium: 2.8 mmol/L — ABNORMAL LOW (ref 3.5–5.1)
Potassium: 3.4 mmol/L — ABNORMAL LOW (ref 3.5–5.1)
Sodium: 143 mmol/L (ref 135–145)
Sodium: 143 mmol/L (ref 135–145)
TCO2: 21 mmol/L — ABNORMAL LOW (ref 22–32)
TCO2: 20 mmol/L — ABNORMAL LOW (ref 22–32)

## 2023-07-29 LAB — CK: Total CK: 99 U/L (ref 49–397)

## 2023-07-29 LAB — ACETAMINOPHEN LEVEL: Acetaminophen (Tylenol), Serum: <10 ug/mL — ABNORMAL LOW (ref 10–30)

## 2023-07-29 LAB — RAPID URINE DRUG SCREEN, HOSP PERFORMED
Amphetamines: NOT DETECTED
Barbiturates: NOT DETECTED
Benzodiazepines: NOT DETECTED
Cocaine: NOT DETECTED
Opiates: NOT DETECTED
Tetrahydrocannabinol: POSITIVE — AB

## 2023-07-29 LAB — LIPASE, BLOOD: Lipase: 30 U/L (ref 11–51)

## 2023-07-29 LAB — CBC
HCT: 42.6 % (ref 39.0–52.0)
Hemoglobin: 13.9 g/dL (ref 13.0–17.0)
MCH: 26.4 pg (ref 26.0–34.0)
MCHC: 32.6 g/dL (ref 30.0–36.0)
MCV: 81 fL (ref 80.0–100.0)
Platelets: 257 10*3/uL (ref 150–400)
RBC: 5.26 MIL/uL (ref 4.22–5.81)
RDW: 13.8 % (ref 11.5–15.5)
WBC: 9.4 10*3/uL (ref 4.0–10.5)
nRBC: 0 % (ref 0.0–0.2)

## 2023-07-29 LAB — ETHANOL: Alcohol, Ethyl (B): <10 mg/dL (ref <10)

## 2023-07-29 LAB — I-STAT CG4 LACTIC ACID, ED
Lactic Acid, Venous: 5.5 mmol/L (ref 0.5–1.9)
Lactic Acid, Venous: 4 mmol/L (ref 0.5–1.9)

## 2023-07-29 LAB — CBG MONITORING, ED: Glucose-Capillary: 147 mg/dL — ABNORMAL HIGH (ref 70–99)

## 2023-07-29 LAB — SALICYLATE LEVEL: Salicylate Lvl: <7 mg/dL — ABNORMAL LOW (ref 7.0–30.0)

## 2023-07-29 LAB — TROPONIN I (HIGH SENSITIVITY)
Troponin I (High Sensitivity): <2 ng/L (ref <18)
Troponin I (High Sensitivity): 3 ng/L (ref <18)

## 2023-07-29 LAB — MAGNESIUM: Magnesium: 1.9 mg/dL (ref 1.7–2.4)

## 2023-07-29 SURGERY — LAPAROSCOPIC CHOLECYSTECTOMY
Anesthesia: General | Site: Abdomen

## 2023-07-29 MED ORDER — MIDAZOLAM HCL 2 MG/2ML IJ SOLN
INTRAMUSCULAR | Status: AC
Start: 2023-07-29 — End: ?
  Filled 2023-07-29: qty 2

## 2023-07-29 MED ORDER — SODIUM CHLORIDE 0.9 % IR SOLN
Status: DC | PRN
  Administered 2023-07-29: 1000 mL

## 2023-07-29 MED ORDER — DEXAMETHASONE SODIUM PHOSPHATE 10 MG/ML IJ SOLN
INTRAMUSCULAR | Status: AC
  Filled 2023-07-29: qty 1

## 2023-07-29 MED ORDER — VANCOMYCIN HCL 10 G IV SOLR
2500.0000 mg | Freq: Once | INTRAVENOUS | Status: AC
  Administered 2023-07-29: 2500 mg via INTRAVENOUS
  Filled 2023-07-29: qty 2500

## 2023-07-29 MED ORDER — VANCOMYCIN HCL IN DEXTROSE 1-5 GM/200ML-% IV SOLN: 1000.0000 mg | Freq: Once | INTRAVENOUS | Status: DC

## 2023-07-29 MED ORDER — LACTATED RINGERS IV BOLUS (SEPSIS)
1000.0000 mL | Freq: Once | INTRAVENOUS | Status: AC
  Administered 2023-07-29: 1000 mL via INTRAVENOUS

## 2023-07-29 MED ORDER — ROCURONIUM BROMIDE 10 MG/ML (PF) SYRINGE
PREFILLED_SYRINGE | INTRAVENOUS | Status: AC
  Filled 2023-07-29: qty 10

## 2023-07-29 MED ORDER — LORAZEPAM 2 MG/ML IJ SOLN: 0.0000 mg | Freq: Two times a day (BID) | INTRAMUSCULAR | Status: DC

## 2023-07-29 MED ORDER — ONDANSETRON HCL 4 MG/2ML IJ SOLN
4.0000 mg | Freq: Once | INTRAMUSCULAR | Status: AC
  Administered 2023-07-29: 4 mg via INTRAVENOUS
  Filled 2023-07-29: qty 2

## 2023-07-29 MED ORDER — LORAZEPAM 1 MG PO TABS: 0.0000 mg | ORAL_TABLET | Freq: Four times a day (QID) | ORAL | Status: DC

## 2023-07-29 MED ORDER — METHOCARBAMOL 1000 MG/10ML IJ SOLN
500.0000 mg | Freq: Four times a day (QID) | INTRAMUSCULAR | Status: DC | PRN
  Administered 2023-07-29: 500 mg via INTRAVENOUS
  Filled 2023-07-29: qty 10

## 2023-07-29 MED ORDER — FENTANYL CITRATE (PF) 250 MCG/5ML IJ SOLN
INTRAMUSCULAR | Status: DC | PRN
  Administered 2023-07-29: 50 ug via INTRAVENOUS
  Administered 2023-07-29: 100 ug via INTRAVENOUS

## 2023-07-29 MED ORDER — HYDROMORPHONE HCL 1 MG/ML IJ SOLN
0.5000 mg | Freq: Once | INTRAMUSCULAR | Status: AC
  Administered 2023-07-29: 0.5 mg via INTRAVENOUS
  Filled 2023-07-29: qty 1

## 2023-07-29 MED ORDER — ORAL CARE MOUTH RINSE: 15.0000 mL | Freq: Once | OROMUCOSAL | Status: AC

## 2023-07-29 MED ORDER — CEFAZOLIN SODIUM 1 G IJ SOLR
INTRAMUSCULAR | Status: AC
  Filled 2023-07-29: qty 20

## 2023-07-29 MED ORDER — CHLORHEXIDINE GLUCONATE 0.12 % MT SOLN: 15.0000 mL | Freq: Once | OROMUCOSAL | Status: AC

## 2023-07-29 MED ORDER — LIDOCAINE 2% (20 MG/ML) 5 ML SYRINGE
INTRAMUSCULAR | Status: AC
  Filled 2023-07-29: qty 5

## 2023-07-29 MED ORDER — THIAMINE HCL 100 MG/ML IJ SOLN
100.0000 mg | Freq: Every day | INTRAMUSCULAR | Status: DC
Start: 2023-07-29 — End: 2023-07-29

## 2023-07-29 MED ORDER — HYDROMORPHONE HCL 1 MG/ML IJ SOLN
1.0000 mg | Freq: Once | INTRAMUSCULAR | Status: AC
  Administered 2023-07-29: 1 mg via INTRAVENOUS
  Filled 2023-07-29: qty 1

## 2023-07-29 MED ORDER — PROPOFOL 10 MG/ML IV BOLUS
INTRAVENOUS | Status: AC
  Filled 2023-07-29: qty 20

## 2023-07-29 MED ORDER — LORAZEPAM 2 MG/ML IJ SOLN: 0.0000 mg | Freq: Four times a day (QID) | INTRAMUSCULAR | Status: DC

## 2023-07-29 MED ORDER — THIAMINE MONONITRATE 100 MG PO TABS
100.0000 mg | ORAL_TABLET | Freq: Every day | ORAL | Status: DC
Start: 2023-07-29 — End: 2023-07-29
  Administered 2023-07-29: 100 mg via ORAL
  Filled 2023-07-29: qty 1

## 2023-07-29 MED ORDER — INDOCYANINE GREEN 25 MG IV SOLR
2.5000 mg | Freq: Once | INTRAVENOUS | Status: AC
  Administered 2023-07-29: 2.5 mg via INTRAVENOUS
  Filled 2023-07-29: qty 10

## 2023-07-29 MED ORDER — LACTATED RINGERS IV BOLUS
1000.0000 mL | Freq: Once | INTRAVENOUS | Status: AC
  Administered 2023-07-29: 1000 mL via INTRAVENOUS

## 2023-07-29 MED ORDER — LACTATED RINGERS IV BOLUS (SEPSIS): 1000.0000 mL | Freq: Once | INTRAVENOUS | Status: DC

## 2023-07-29 MED ORDER — BUPIVACAINE-EPINEPHRINE (PF) 0.25% -1:200000 IJ SOLN
INTRAMUSCULAR | Status: AC
  Filled 2023-07-29: qty 30

## 2023-07-29 MED ORDER — DEXAMETHASONE SODIUM PHOSPHATE 10 MG/ML IJ SOLN
INTRAMUSCULAR | Status: DC | PRN
  Administered 2023-07-29: 10 mg via INTRAVENOUS

## 2023-07-29 MED ORDER — ROCURONIUM BROMIDE 10 MG/ML (PF) SYRINGE
PREFILLED_SYRINGE | INTRAVENOUS | Status: DC | PRN
  Administered 2023-07-29: 80 mg via INTRAVENOUS

## 2023-07-29 MED ORDER — CHLORHEXIDINE GLUCONATE CLOTH 2 % EX PADS
6.0000 | MEDICATED_PAD | Freq: Once | CUTANEOUS | Status: DC
Start: 2023-07-29 — End: 2023-07-29

## 2023-07-29 MED ORDER — FENTANYL CITRATE (PF) 250 MCG/5ML IJ SOLN
INTRAMUSCULAR | Status: AC
  Filled 2023-07-29: qty 5

## 2023-07-29 MED ORDER — HYDROMORPHONE HCL 1 MG/ML IJ SOLN
0.5000 mg | Freq: Once | INTRAMUSCULAR | Status: AC
  Administered 2023-07-29: 0.5 mg via INTRAVENOUS

## 2023-07-29 MED ORDER — ONDANSETRON HCL 4 MG/2ML IJ SOLN
INTRAMUSCULAR | Status: AC
  Filled 2023-07-29: qty 2

## 2023-07-29 MED ORDER — METRONIDAZOLE 500 MG/100ML IV SOLN
500.0000 mg | Freq: Once | INTRAVENOUS | Status: AC
  Administered 2023-07-29: 500 mg via INTRAVENOUS
  Filled 2023-07-29: qty 100

## 2023-07-29 MED ORDER — BUPIVACAINE-EPINEPHRINE 0.25% -1:200000 IJ SOLN
INTRAMUSCULAR | Status: DC | PRN
  Administered 2023-07-29: 9 mL

## 2023-07-29 MED ORDER — 0.9 % SODIUM CHLORIDE (POUR BTL) OPTIME
TOPICAL | Status: DC | PRN
  Administered 2023-07-29: 1000 mL

## 2023-07-29 MED ORDER — PROPOFOL 10 MG/ML IV BOLUS
INTRAVENOUS | Status: DC | PRN
  Administered 2023-07-29: 200 mg via INTRAVENOUS

## 2023-07-29 MED ORDER — POTASSIUM CHLORIDE 10 MEQ/100ML IV SOLN: 10.0000 meq | INTRAVENOUS | Status: DC

## 2023-07-29 MED ORDER — CEFAZOLIN SODIUM-DEXTROSE 2-3 GM-%(50ML) IV SOLR
INTRAVENOUS | Status: DC | PRN
  Administered 2023-07-29: 2 g via INTRAVENOUS

## 2023-07-29 MED ORDER — MORPHINE SULFATE (PF) 4 MG/ML IV SOLN: 4.0000 mg | Freq: Once | INTRAVENOUS | Status: DC

## 2023-07-29 MED ORDER — CHLORHEXIDINE GLUCONATE CLOTH 2 % EX PADS: 6.0000 | MEDICATED_PAD | Freq: Once | CUTANEOUS | Status: DC

## 2023-07-29 MED ORDER — SUGAMMADEX SODIUM 200 MG/2ML IV SOLN
INTRAVENOUS | Status: DC | PRN
  Administered 2023-07-29: 350 mg via INTRAVENOUS

## 2023-07-29 MED ORDER — CHLORHEXIDINE GLUCONATE 0.12 % MT SOLN
OROMUCOSAL | Status: AC
  Filled 2023-07-29: qty 15

## 2023-07-29 MED ORDER — CHLORHEXIDINE GLUCONATE 0.12 % MT SOLN
OROMUCOSAL | Status: AC
  Administered 2023-07-29: 15 mL via OROMUCOSAL
  Filled 2023-07-29: qty 15

## 2023-07-29 MED ORDER — ESMOLOL HCL 100 MG/10ML IV SOLN
INTRAVENOUS | Status: AC
  Filled 2023-07-29: qty 10

## 2023-07-29 MED ORDER — IOHEXOL 350 MG/ML SOLN
75.0000 mL | Freq: Once | INTRAVENOUS | Status: AC | PRN
  Administered 2023-07-29: 75 mL via INTRAVENOUS

## 2023-07-29 MED ORDER — HYDROMORPHONE HCL 1 MG/ML IJ SOLN: 0.2500 mg | INTRAMUSCULAR | Status: DC | PRN

## 2023-07-29 MED ORDER — ONDANSETRON HCL 4 MG/2ML IJ SOLN
INTRAMUSCULAR | Status: DC | PRN
  Administered 2023-07-29: 4 mg via INTRAVENOUS

## 2023-07-29 MED ORDER — POTASSIUM CHLORIDE 10 MEQ/100ML IV SOLN
10.0000 meq | INTRAVENOUS | Status: AC
  Administered 2023-07-29 (×2): 10 meq via INTRAVENOUS
  Filled 2023-07-29 (×2): qty 100

## 2023-07-29 MED ORDER — SODIUM CHLORIDE 0.9 % IV SOLN
2.0000 g | Freq: Once | INTRAVENOUS | Status: AC
  Administered 2023-07-29: 2 g via INTRAVENOUS
  Filled 2023-07-29: qty 12.5

## 2023-07-29 MED ORDER — OXYCODONE-ACETAMINOPHEN 5-325 MG PO TABS: 1.0000 | ORAL_TABLET | ORAL | 0 refills | Status: AC | PRN

## 2023-07-29 MED ORDER — LACTATED RINGERS IV SOLN: INTRAVENOUS | Status: DC

## 2023-07-29 MED ORDER — ESMOLOL HCL 100 MG/10ML IV SOLN
INTRAVENOUS | Status: DC | PRN
  Administered 2023-07-29: 20 mg via INTRAVENOUS

## 2023-07-29 MED ORDER — OXYCODONE HCL 5 MG PO TABS
5.0000 mg | ORAL_TABLET | ORAL | Status: DC | PRN
  Administered 2023-07-29: 10 mg via ORAL
  Filled 2023-07-29: qty 2

## 2023-07-29 MED ORDER — MIDAZOLAM HCL 2 MG/2ML IJ SOLN
INTRAMUSCULAR | Status: DC | PRN
  Administered 2023-07-29: 2 mg via INTRAVENOUS

## 2023-07-29 MED ORDER — LIDOCAINE 2% (20 MG/ML) 5 ML SYRINGE
INTRAMUSCULAR | Status: DC | PRN
  Administered 2023-07-29: 50 mg via INTRAVENOUS

## 2023-07-29 MED ORDER — LACTATED RINGERS IV BOLUS (SEPSIS): 500.0000 mL | Freq: Once | INTRAVENOUS | Status: DC

## 2023-07-29 MED ORDER — LORAZEPAM 1 MG PO TABS: 0.0000 mg | ORAL_TABLET | Freq: Two times a day (BID) | ORAL | Status: DC

## 2023-07-29 SURGICAL SUPPLY — 36 items
BAG COUNTER SPONGE SURGICOUNT (BAG) ×2 IMPLANT
CANISTER SUCT 3000ML PPV (MISCELLANEOUS) ×2 IMPLANT
CHLORAPREP W/TINT 26 (MISCELLANEOUS) ×2 IMPLANT
CLIP LIGATING HEMO O LOK GREEN (MISCELLANEOUS) ×2 IMPLANT
COVER SURGICAL LIGHT HANDLE (MISCELLANEOUS) ×2 IMPLANT
COVER TRANSDUCER ULTRASND (DRAPES) ×2 IMPLANT
DERMABOND ADVANCED .7 DNX12 (GAUZE/BANDAGES/DRESSINGS) ×2 IMPLANT
ELECT REM PT RETURN 9FT ADLT (ELECTROSURGICAL) ×2
ELECTRODE REM PT RTRN 9FT ADLT (ELECTROSURGICAL) ×2 IMPLANT
GLOVE BIO SURGEON STRL SZ7.5 (GLOVE) ×4 IMPLANT
GOWN STRL REUS W/ TWL LRG LVL3 (GOWN DISPOSABLE) ×4 IMPLANT
GOWN STRL REUS W/ TWL XL LVL3 (GOWN DISPOSABLE) ×2 IMPLANT
GRASPER SUT TROCAR 14GX15 (MISCELLANEOUS) ×2 IMPLANT
IRRIG SUCT STRYKERFLOW 2 WTIP (MISCELLANEOUS) ×2
IRRIGATION SUCT STRKRFLW 2 WTP (MISCELLANEOUS) ×2 IMPLANT
KIT BASIN OR (CUSTOM PROCEDURE TRAY) ×2 IMPLANT
KIT IMAGING PINPOINTPAQ (MISCELLANEOUS) IMPLANT
KIT TURNOVER KIT B (KITS) ×2 IMPLANT
NDL INSUFFLATION 14GA 120MM (NEEDLE) ×2 IMPLANT
NEEDLE INSUFFLATION 14GA 120MM (NEEDLE) ×2 IMPLANT
NS IRRIG 1000ML POUR BTL (IV SOLUTION) ×2 IMPLANT
PAD ARMBOARD 7.5X6 YLW CONV (MISCELLANEOUS) ×2 IMPLANT
POUCH LAPAROSCOPIC INSTRUMENT (MISCELLANEOUS) ×2 IMPLANT
POUCH RETRIEVAL ECOSAC 10 (ENDOMECHANICALS) IMPLANT
SCISSORS LAP 5X35 DISP (ENDOMECHANICALS) ×2 IMPLANT
SET TUBE SMOKE EVAC HIGH FLOW (TUBING) ×2 IMPLANT
SLEEVE Z-THREAD 5X100MM (TROCAR) ×2 IMPLANT
SPECIMEN JAR SMALL (MISCELLANEOUS) ×2 IMPLANT
SUT MNCRL AB 4-0 PS2 18 (SUTURE) ×2 IMPLANT
TOWEL GREEN STERILE (TOWEL DISPOSABLE) ×2 IMPLANT
TOWEL GREEN STERILE FF (TOWEL DISPOSABLE) ×2 IMPLANT
TRAY LAPAROSCOPIC MC (CUSTOM PROCEDURE TRAY) ×2 IMPLANT
TROCAR 11X100 Z THREAD (TROCAR) ×2 IMPLANT
TROCAR Z-THREAD OPTICAL 5X100M (TROCAR) ×2 IMPLANT
WARMER LAPAROSCOPE (MISCELLANEOUS) ×2 IMPLANT
WATER STERILE IRR 1000ML POUR (IV SOLUTION) ×2 IMPLANT

## 2023-07-29 NOTE — Anesthesia Preprocedure Evaluation (Addendum)
Anesthesia Evaluation  Patient identified by MRN, date of birth, ID band Patient awake    Reviewed: Allergy & Precautions, H&P , NPO status , Patient's Chart, lab work & pertinent test results  Airway Mallampati: II  TM Distance: >3 FB Neck ROM: Full    Dental no notable dental hx. (+) Teeth Intact, Dental Advisory Given   Pulmonary neg pulmonary ROS   Pulmonary exam normal breath sounds clear to auscultation       Cardiovascular negative cardio ROS  Rhythm:Regular Rate:Normal     Neuro/Psych  Headaches  negative psych ROS   GI/Hepatic negative GI ROS, Neg liver ROS,,,  Endo/Other  negative endocrine ROS    Renal/GU negative Renal ROS  negative genitourinary   Musculoskeletal   Abdominal   Peds  Hematology negative hematology ROS (+)   Anesthesia Other Findings   Reproductive/Obstetrics negative OB ROS                             Anesthesia Physical Anesthesia Plan  ASA: 2  Anesthesia Plan: General   Post-op Pain Management: Ofirmev IV (intra-op)*   Induction: Intravenous  PONV Risk Score and Plan: 3 and Ondansetron, Dexamethasone and Midazolam  Airway Management Planned: Oral ETT  Additional Equipment:   Intra-op Plan:   Post-operative Plan: Extubation in OR  Informed Consent: I have reviewed the patients History and Physical, chart, labs and discussed the procedure including the risks, benefits and alternatives for the proposed anesthesia with the patient or authorized representative who has indicated his/her understanding and acceptance.     Dental advisory given  Plan Discussed with: CRNA  Anesthesia Plan Comments:        Anesthesia Quick Evaluation

## 2023-07-29 NOTE — ED Provider Triage Note (Signed)
  Emergency Medicine Provider Triage Evaluation Note  MRN:  [id number]  Arrival date & time: 07/29/23    Medically screening exam initiated at 3:28 AM.   CC:   Abdominal Pain and Chills   HPI:  Gavin Young is a 42 y.o. year-old male presents to the ED with chief complaint of n/v and abdominal pain for the past few days.  Worsened tonight.  History provided by patient. ROS:  -As included in HPI PE:   Vitals:   07/29/23 0259 07/29/23 0315  BP: (!) 148/97   Pulse: 84   Resp: 18 (!) 30  Temp: 98.3 F (36.8 C)   SpO2: 100%     Ill appearing, appears uncomfortable Tachypnea Umbilical hernia, but easily reducible  MDM:     I've ordered labs in triage to expedite lab/diagnostic workup.  I stat lactic is 5.  Anion gap is 23.  Glucose is only mildly elevated.  Consider euglycemic DKA?   Patient needs a room.  NOT appropriate for waiting room.  Charge and Triage RN notified.  Patient was informed that the remainder of the evaluation will be completed by another provider, this initial triage assessment does not replace that evaluation, and the importance of remaining in the ED until their evaluation is complete.    Roxy Horseman, PA-C 07/29/23 [phone]

## 2023-07-29 NOTE — ED Notes (Signed)
Attempted EKG on Pt was unsuccessful due to Pt shaking. RN notified

## 2023-07-29 NOTE — Discharge Instructions (Addendum)

## 2023-07-29 NOTE — Anesthesia Postprocedure Evaluation (Signed)
Anesthesia Post Note  Patient: Gavin Young  Procedure(s) Performed: LAPAROSCOPIC CHOLECYSTECTOMY (Abdomen) INDOCYANINE GREEN FLUORESCENCE IMAGING (ICG) (Abdomen)     Patient location during evaluation: PACU Anesthesia Type: General Level of consciousness: awake and alert Pain management: pain level controlled Vital Signs Assessment: post-procedure vital signs reviewed and stable Respiratory status: spontaneous breathing, nonlabored ventilation and respiratory function stable Cardiovascular status: blood pressure returned to baseline and stable Postop Assessment: no apparent nausea or vomiting Anesthetic complications: no  No notable events documented.  Last Vitals:  Vitals:   07/29/23 1430 07/29/23 1451  BP: (!) 140/90 (!) 151/98  Pulse: 62 71  Resp: 17 (!) 23  Temp:    SpO2: 96% 97%    Last Pain:  Vitals:   07/29/23 1327  TempSrc:   PainSc: 0-No pain                 Hillarie Harrigan,W. EDMOND

## 2023-07-29 NOTE — Anesthesia Procedure Notes (Signed)
Procedure Name: Intubation Date/Time: 07/29/2023 12:11 PM  Performed by: Pincus Large, CRNAPre-anesthesia Checklist: Patient identified, Emergency Drugs available, Suction available and Patient being monitored Patient Re-evaluated:Patient Re-evaluated prior to induction Oxygen Delivery Method: Circle System Utilized Preoxygenation: Pre-oxygenation with 100% oxygen Induction Type: IV induction Ventilation: Mask ventilation without difficulty Laryngoscope Size: Mac and 4 Grade View: Grade II Tube type: Oral Tube size: 7.5 mm Number of attempts: 1 Airway Equipment and Method: Stylet and Oral airway Placement Confirmation: ETT inserted through vocal cords under direct vision, positive ETCO2 and breath sounds checked- equal and bilateral Secured at: 24 cm Tube secured with: Tape Dental Injury: Teeth and Oropharynx as per pre-operative assessment

## 2023-07-29 NOTE — ED Triage Notes (Signed)
Pt arrives to ED c/o umbilical ABD pain x 2 days. Pt reports that pain feels like it burning and non radiating. Pt reports that he is having elimination issues and has been constipated. Pt with tenderness upon palpation and visible mas above belly button. Pt with 2 syncopal episodes while in Triage. Currently A/o x 4

## 2023-07-29 NOTE — H&P (Addendum)
History and Physical Exam  Gavin Young 03/06/1981  [id number].    Requesting MD: Dr. Madilyn Hook Chief Complaint/Reason for Consult: abdominal pain, ?cholecystitis  HPI:  42 y.o. male with medical history significant for migraines, back spasms, h/o subdural hematoma who presented to [facility] ED with abdominal pain. He first noted similar pain this past Sunday after eating wings but it was more mild and went away. Pain started again last night waking him from sleeping. He had had bojangles for dinner. He has had associated nausea and emesis. He has had one loose bowel movement since pain started. He denies hematemesis, coffee ground emesis, hematochezia, melena. He denies history of similar pain episodes in the past.   Work up in the ED concerning for acalculous cholecystitis and general surgery asked to see.  He tells me pain has been persistent and severe in epigastrium and RUQ despite pain medications. He denies medical problems other than those listed above. He admits to occasional alcohol use. He denies other substance use  He is accompanied by his ex wife  Substance use: occasional etoh use Allergies: nkda Blood thinners: none Past Surgeries: non   ROS: Reviewed and as above  Family History  Problem Relation Age of Onset   Arthritis Mother    Healthy Sister    Healthy Brother     Past Medical History:  Diagnosis Date   Migraines     History reviewed. No pertinent surgical history.  Social History:  reports that he has never smoked. He has never used smokeless tobacco. He reports that he does not drink alcohol and does not use drugs.  Allergies: No Known Allergies  (Not in a hospital admission)   Blood pressure (!) 123/107, pulse 79, temperature 97.8 F (36.6 C), temperature source Oral, resp. rate 14, weight 120.2 kg, SpO2 100%. Physical Exam: General: pleasant, WD, male who is laying in bed in NAD. Uncomfortable appearing HEENT: head is normocephalic,  atraumatic.  Sclera are noninjected.  Pupils equal and round. EOMs intact.  Ears and nose without any masses or lesions.  Mouth is pink and moist Heart: regular, rate, and rhythm.   Lungs:  Respiratory effort nonlabored on 2lpm supplemental O2 Abd: soft, ND, no masses or organomegaly. Small soft reducible umbilical hernia. Significant tenderness to palpation RUQ and epigastrium with guarding. RUQ>epigastrium MSK: all 4 extremities are symmetrical with no cyanosis, clubbing, or edema. Skin: warm and dry with no masses, lesions, or rashes Neuro: Cranial nerves 2-12 grossly intact, sensation is normal throughout Psych: A&Ox3 with an appropriate affect.    Results for orders placed or performed during the hospital encounter of 07/29/23 (from the past 48 hour(s))  CBG monitoring, ED     Status: Abnormal   Collection Time: 07/29/23  3:15 AM  Result Value Ref Range   Glucose-Capillary 147 (H) 70 - 99 mg/dL    Comment: Glucose reference range applies only to samples taken after fasting for at least 8 hours.  I-Stat CG4 Lactic Acid, ED     Status: Abnormal   Collection Time: 07/29/23  3:15 AM  Result Value Ref Range   Lactic Acid, Venous 5.5 (HH) 0.5 - 1.9 mmol/L   Comment NOTIFIED PHYSICIAN   I-stat chem 8, ED (not at [facility], [facility] or [facility])     Status: Abnormal   Collection Time: 07/29/23  3:19 AM  Result Value Ref Range   Sodium 143 135 - 145 mmol/L   Potassium 2.8 (L) 3.5 - 5.1 mmol/L  Chloride 103 98 - 111 mmol/L   BUN 12 6 - 20 mg/dL   Creatinine, Ser 4.09 0.61 - 1.24 mg/dL   Glucose, Bld 811 (H) 70 - 99 mg/dL    Comment: Glucose reference range applies only to samples taken after fasting for at least 8 hours.   Calcium, Ion 1.09 (L) 1.15 - 1.40 mmol/L   TCO2 21 (L) 22 - 32 mmol/L   Hemoglobin 15.0 13.0 - 17.0 g/dL   HCT 91.4 78.2 - 95.6 %  Comprehensive metabolic panel     Status: Abnormal   Collection Time: 07/29/23  3:19 AM  Result Value Ref Range   Sodium 140 135 - 145 mmol/L    Potassium 2.8 (L) 3.5 - 5.1 mmol/L   Chloride 106 98 - 111 mmol/L   CO2 20 (L) 22 - 32 mmol/L   Glucose, Bld 140 (H) 70 - 99 mg/dL    Comment: Glucose reference range applies only to samples taken after fasting for at least 8 hours.   BUN 12 6 - 20 mg/dL   Creatinine, Ser 2.13 0.61 - 1.24 mg/dL   Calcium 8.9 8.9 - 08.6 mg/dL   Total Protein 7.8 6.5 - 8.1 g/dL   Albumin 3.5 3.5 - 5.0 g/dL   AST 24 15 - 41 U/L   ALT 30 0 - 44 U/L   Alkaline Phosphatase 109 38 - 126 U/L   Total Bilirubin 0.4 <1.2 mg/dL   GFR, Estimated >57 >84 mL/min    Comment: (NOTE) Calculated using the CKD-EPI Creatinine Equation (2021)    Anion gap 14 5 - 15    Comment: Performed at [facility], [address], [place], [place] [address]  CBC     Status: None   Collection Time: 07/29/23  3:19 AM  Result Value Ref Range   WBC 9.4 4.0 - 10.5 K/uL   RBC 5.26 4.22 - 5.81 MIL/uL   Hemoglobin 13.9 13.0 - 17.0 g/dL   HCT 52.8 41.3 - 24.4 %   MCV 81.0 80.0 - 100.0 fL   MCH 26.4 26.0 - 34.0 pg   MCHC 32.6 30.0 - 36.0 g/dL   RDW 01.0 27.2 - 53.6 %   Platelets 257 150 - 400 K/uL   nRBC 0.0 0.0 - 0.2 %    Comment: Performed at [facility], [address], [place], [place] [address]  Lipase, blood     Status: None   Collection Time: 07/29/23  3:19 AM  Result Value Ref Range   Lipase 30 11 - 51 U/L    Comment: Performed at [facility], [address] [place], [place] [address]  Troponin I (High Sensitivity)     Status: None   Collection Time: 07/29/23  3:19 AM  Result Value Ref Range   Troponin I (High Sensitivity) <2 <18 ng/L    Comment: (NOTE) Elevated high sensitivity troponin I (hsTnI) values and significant  changes across serial measurements may suggest ACS but many other  chronic and acute conditions are known to elevate hsTnI results.  Refer to the "Links" section for chest pain algorithms and additional  guidance. Performed at [facility], [address], [place],  [place] [address]   CK     Status: None   Collection Time: 07/29/23  3:19 AM  Result Value Ref Range   Total CK 99 49 - 397 U/L    Comment: Performed at [facility], [address]  [place], [place] [address]  I-Stat venous blood gas, ([facility] ED, [facility], [facility])     Status: Abnormal   Collection Time: 07/29/23  3:30 AM  Result Value Ref Range   pH, Ven 7.475 (H) 7.25 - 7.43   pCO2, Ven 31.1 (L) 44 - 60 mmHg   pO2, Ven 37 32 - 45 mmHg   Bicarbonate 22.9 20.0 - 28.0 mmol/L   TCO2 24 22 - 32 mmol/L   O2 Saturation 76 %   Acid-Base Excess 0.0 0.0 - 2.0 mmol/L   Sodium 142 135 - 145 mmol/L   Potassium 2.8 (L) 3.5 - 5.1 mmol/L   Calcium, Ion 1.10 (L) 1.15 - 1.40 mmol/L   HCT 43.0 39.0 - 52.0 %   Hemoglobin 14.6 13.0 - 17.0 g/dL   Sample type VENOUS    Comment NOTIFIED PHYSICIAN   Culture, blood (single)     Status: None (Preliminary result)   Collection Time: 07/29/23  4:42 AM   Specimen: BLOOD LEFT HAND  Result Value Ref Range   Specimen Description BLOOD LEFT HAND    Special Requests      BOTTLES DRAWN AEROBIC AND ANAEROBIC Blood Culture adequate volume   Culture      NO GROWTH < 12 HOURS Performed at [facility], [address], [place], [place] [address]    Report Status PENDING   Beta-hydroxybutyric acid     Status: None   Collection Time: 07/29/23  4:44 AM  Result Value Ref Range   Beta-Hydroxybutyric Acid 0.07 0.05 - 0.27 mmol/L    Comment: Performed at [facility], [address] [place], [place] [address]  Ethanol     Status: None   Collection Time: 07/29/23  4:44 AM  Result Value Ref Range   Alcohol, Ethyl (B) <10 <10 mg/dL    Comment: (NOTE) Lowest detectable limit for serum alcohol is 10 mg/dL.  For medical purposes only. Performed at [facility], [address], [place], [place] [address]   Salicylate level     Status: Abnormal   Collection Time: 07/29/23  4:44 AM  Result Value Ref Range   Salicylate Lvl <7.0 (L) 7.0 - 30.0 mg/dL    Comment:  Performed at [facility], [address], [place], [place] [address]  Acetaminophen level     Status: Abnormal   Collection Time: 07/29/23  4:44 AM  Result Value Ref Range   Acetaminophen (Tylenol), Serum <10 (L) 10 - 30 ug/mL    Comment: (NOTE) Therapeutic concentrations vary significantly. A range of 10-30 ug/mL  may be an effective concentration for many patients. However, some  are best treated at concentrations outside of this range. Acetaminophen concentrations >150 ug/mL at 4 hours after ingestion  and >50 ug/mL at 12 hours after ingestion are often associated with  toxic reactions.  Performed at [facility], [address] [place], [place] [address]   Troponin I (High Sensitivity)     Status: None   Collection Time: 07/29/23  4:44 AM  Result Value Ref Range   Troponin I (High Sensitivity) 3 <18 ng/L    Comment: (NOTE) Elevated high sensitivity troponin I (hsTnI) values and significant  changes across serial measurements may suggest ACS but many other  chronic and acute conditions are known to elevate hsTnI results.  Refer to the "Links" section for chest pain algorithms and additional  guidance. Performed at [facility], [address] [place], [place] [address]  I-stat chem 8, ed     Status: Abnormal   Collection Time: 07/29/23  4:55 AM  Result Value Ref Range   Sodium 143 135 - 145 mmol/L   Potassium 3.4 (L) 3.5 - 5.1 mmol/L   Chloride 104 98 - 111 mmol/L   BUN 11 6 - 20 mg/dL   Creatinine, Ser 0.98 0.61 - 1.24 mg/dL   Glucose, Bld 119 (H) 70 - 99 mg/dL    Comment: Glucose reference range applies only to samples taken after fasting for at least 8 hours.   Calcium, Ion 1.14 (L) 1.15 - 1.40 mmol/L   TCO2 20 (L) 22 - 32 mmol/L   Hemoglobin 14.3 13.0 - 17.0 g/dL   HCT 14.7 82.9 - 56.2 %  I-Stat CG4 Lactic Acid     Status: Abnormal   Collection Time: 07/29/23  4:56 AM  Result Value Ref Range   Lactic Acid, Venous 4.0 (HH) 0.5 - 1.9 mmol/L    Comment NOTIFIED PHYSICIAN   I-Stat venous blood gas, ED     Status: Abnormal   Collection Time: 07/29/23  4:56 AM  Result Value Ref Range   pH, Ven 7.495 (H) 7.25 - 7.43   pCO2, Ven 29.6 (L) 44 - 60 mmHg   pO2, Ven 44 32 - 45 mmHg   Bicarbonate 22.8 20.0 - 28.0 mmol/L   TCO2 24 22 - 32 mmol/L   O2 Saturation 84 %   Acid-Base Excess 0.0 0.0 - 2.0 mmol/L   Sodium 142 135 - 145 mmol/L   Potassium 3.3 (L) 3.5 - 5.1 mmol/L   Calcium, Ion 1.14 (L) 1.15 - 1.40 mmol/L   HCT 41.0 39.0 - 52.0 %   Hemoglobin 13.9 13.0 - 17.0 g/dL   Sample type VENOUS   Rapid urine drug screen (hospital performed)     Status: Abnormal   Collection Time: 07/29/23  5:48 AM  Result Value Ref Range   Opiates NONE DETECTED NONE DETECTED   Cocaine NONE DETECTED NONE DETECTED   Benzodiazepines NONE DETECTED NONE DETECTED   Amphetamines NONE DETECTED NONE DETECTED   Tetrahydrocannabinol POSITIVE (A) NONE DETECTED   Barbiturates NONE DETECTED NONE DETECTED    Comment: (NOTE) DRUG SCREEN FOR MEDICAL PURPOSES ONLY.  IF CONFIRMATION IS NEEDED FOR ANY PURPOSE, NOTIFY LAB WITHIN 5 DAYS.  LOWEST DETECTABLE LIMITS FOR URINE DRUG SCREEN Drug Class                     Cutoff (ng/mL) Amphetamine and metabolites    1000 Barbiturate and metabolites    200 Benzodiazepine                 200 Opiates and metabolites        300 Cocaine and metabolites        300 THC                            50 Performed at [facility], [address] [place], [place] [address]    US Abdomen Limited RUQ (LIVER/GB)  Result Date: 07/29/2023 CLINICAL DATA:  Abnormal appearance of the gallbladder on CT today. EXAM: ULTRASOUND ABDOMEN LIMITED RIGHT UPPER QUADRANT COMPARISON:  CT today is the only prior abdominal study. FINDINGS: Gallbladder: Gallbladder dilatation to 11.3 cm length again is noted, with wall thickening and pericholecystic fluid and a positive sonographic Murphy's sign. The free wall thickness is 5.8 mm. There is  layering hypoechoic sludge  but no shadowing stones. No evidence of a focal wall abnormality. Common bile duct: Diameter: 5 mm.  No intrahepatic biliary prominence. Liver: No focal lesion identified. Within normal limits in parenchymal echogenicity. There is a nodular capsular contour consistent with cirrhosis. There is mild intrahepatic periportal edema which could be due to hepatic dysfunction or fluid overload. Portal vein is patent on color Doppler imaging with normal direction of blood flow towards the liver. The portal vein is prominent measuring 16 mm. Other: None. IMPRESSION: 1. Gallbladder dilatation to 11.3 cm length with wall thickening and pericholecystic fluid and a positive sonographic Murphy's sign. No shadowing stones. Findings are suspicious for acute acalculous cholecystitis with gallbladder sludge. 2. Nodular capsular contour of the liver consistent with cirrhosis. 3. Mild intrahepatic periportal edema which could be due to hepatic dysfunction or fluid overload. 4. Prominent portal vein measuring 16 mm. Electronically Signed   By: Almira Bar M.D.   On: 07/29/2023 06:17   CT ABDOMEN PELVIS W CONTRAST  Result Date: 07/29/2023 CLINICAL DATA:  Nausea, vomiting and abdominal pain for several days. EXAM: CT ABDOMEN AND PELVIS WITH CONTRAST TECHNIQUE: Multidetector CT imaging of the abdomen and pelvis was performed using the standard protocol following bolus administration of intravenous contrast. RADIATION DOSE REDUCTION: This exam was performed according to the departmental dose-optimization program which includes automated exposure control, adjustment of the mA and/or kV according to patient size and/or use of iterative reconstruction technique. CONTRAST:  75mL OMNIPAQUE IOHEXOL 350 MG/ML SOLN COMPARISON:  None Available. FINDINGS: Lower chest: There is mosaicism in both lower lobes which could be due to air trapping or due to heterogeneous atelectasis. No focal pneumonia is seen. There is a  right lower lobe paraspinal subpleural bleb. The cardiac size is normal. Hepatobiliary: The liver demonstrates a nodular contour consistent with cirrhosis. There is no mass enhancement. The hepatic portal vein is prominent measuring 16 mm. There is gallbladder dilatation with the gallbladder 11 cm in length. There are no calcified stones, but there is mild wall thickening, with pericholecystic edema and fluid. Findings could be due to cholecystitis or congestive changes related to liver dysfunction. Pancreas: No abnormality. Spleen: No abnormality.  No splenomegaly. Adrenals/Urinary Tract: Adrenal glands are unremarkable. Kidneys are normal, without renal calculi, focal lesion, or hydronephrosis. Bladder is unremarkable. Stomach/Bowel: Unremarkable gastric wall. Unremarkable unopacified small bowel without evidence of obstruction or inflammation. There are mild thickened folds in the ascending and transverse colon which could be due to nondistention, colitis or portal colopathy. Scattered sigmoid diverticula are present without evidence of diverticulitis. Vascular/Lymphatic: No significant vascular findings are present. No enlarged abdominal or pelvic lymph nodes. Reproductive: Prostate is unremarkable. Both testicles are in the scrotal sac. There are small scrotal hydroceles. Other: Small umbilical fat hernia. No incarcerated hernia. No free hemorrhage, free fluid or free air. Musculoskeletal: Endplate Schmorl's nodes throughout the lumbar spine. There is mild hip DJD. There are changes of bilateral symmetric sacroiliitis with small erosive changes along both sides of the joints. IMPRESSION: 1. Cirrhotic liver with prominent portal vein. No splenomegaly. No ascites. 2. Gallbladder dilatation with mild wall thickening and pericholecystic edema and fluid. Findings could be due to cholecystitis or congestive changes related to liver dysfunction. 3. Mild thickened folds in the ascending and transverse colon which could  be due to nondistention, colitis or portal colopathy. 4. Sigmoid diverticulosis without evidence of diverticulitis. 5. Small scrotal hydroceles. 6. Small umbilical fat hernia. 7. Bilateral symmetric erosive sacroiliitis. 8. 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 Electronically Signed   By: Almira Bar M.D.   On: 07/29/2023 04:35   DG Chest Port 1 View  Result Date: 07/29/2023 CLINICAL DATA:  Shortness of breath. EXAM: PORTABLE CHEST 1 VIEW COMPARISON:  Portable chest 12/12/2016 FINDINGS: There is mild cardiomegaly. The mediastinum is normally outlined. No vascular congestion is seen. The lungs are clear. Mild thoracic spondylosis and slight dextroscoliosis. Overlying monitor wires. IMPRESSION: No evidence of acute chest disease. Mild cardiomegaly. Electronically Signed   By: Almira Bar M.D.   On: 07/29/2023 04:17      Assessment/Plan Acute calculous cholecystitis  Patient seen and examined and relevant labs and imaging reviewed. He has had extensive work up in the ED significant for lactic acidosis and findings concerning for acute acalculous cholecystitis. Of note WBC, LFTs/t bili and lipase all WNL. Afebrile and VSS. Blood cultures pending. He is point tender in RUQ and epigastrium with history consistent with gallbladder etiology. Imaging also notes findings of liver cirrhosis and patient denies recent significant alcohol use, ETOH <10. We discussed risks/benefits of treatment options of antibiotics alone vs antibiotic plus laparoscopic cholecystectomy. He would prefer to proceed with surgical intervention. I have explained the procedure, risks, and aftercare of cholecystectomy.  Risks include but are not limited to bleeding, infection, wound problems, anesthesia, diarrhea, bile leak, injury to common bile duct/liver/intestine.  He seems to understand and agrees to proceed.   FEN: NPO for OR ID: cefepime, flagyl, vanc in ED  Dispo:  OR today. Possible Dc from PACU  I reviewed ED provider notes, last 24 h vitals and pain scores, last 48 h intake and output, last 24 h labs and trends, and last 24 h imaging results.   Eric Form, [facility] Surgery 07/29/2023, 8:14 AM Please see Amion for pager number during day hours 7:00am-4:30pm

## 2023-07-29 NOTE — ED Provider Notes (Signed)
[facility] DEPT [facility] Emergency Department Provider Note MRN:  [id number]  Arrival date & time: 07/29/23     Chief Complaint   Abdominal Pain and Chills   History of Present Illness   Gavin Young is a 42 y.o. year-old male presents to the ED with chief complaint of nausea and vomiting.  He states that his symptoms started a couple days ago, but acutely worsened tonight.  He denies having any diarrhea.  Denies any fevers or chills.  He states that he does not drink any alcohol or use any drugs.  Additional history obtained from ex-wife, who states that he has been a heavy drinker in the past.  He denies any treatments prior to arrival..  History provided by patient.   Review of Systems  Pertinent positive and negative review of systems noted in HPI.    Physical Exam   Vitals:   07/29/23 0526 07/29/23 0530  BP:  (!) 123/107  Pulse:  79  Resp:    Temp:    SpO2: 100%     CONSTITUTIONAL:  ill-appearing, uncomfortable appearing NEURO:  Alert and oriented x 3, CN 3-12 grossly intact EYES:  eyes equal and reactive ENT/NECK:  Supple, no stridor  CARDIO:  normal rate, regular rhythm, appears well-perfused  PULM:  No respiratory distress, tachypneic GI/GU:  non-distended, generalized abdominal discomfort MSK/SPINE:  No gross deformities, no edema, moves all extremities  SKIN:  no rash, atraumatic   *Additional and/or pertinent findings included in MDM below  Diagnostic and Interventional Summary    EKG Interpretation Date/Time:    Ventricular Rate:    PR Interval:    QRS Duration:    QT Interval:    QTC Calculation:   R Axis:      Text Interpretation:         Labs Reviewed  COMPREHENSIVE METABOLIC PANEL - Abnormal; Notable for the following components:      Result Value   Potassium 2.8 (*)    CO2 20 (*)    Glucose, Bld 140 (*)    All other components within normal limits  RAPID URINE DRUG SCREEN, HOSP PERFORMED - Abnormal; Notable for the  following components:   Tetrahydrocannabinol POSITIVE (*)    All other components within normal limits  SALICYLATE LEVEL - Abnormal; Notable for the following components:   Salicylate Lvl <7.0 (*)    All other components within normal limits  ACETAMINOPHEN LEVEL - Abnormal; Notable for the following components:   Acetaminophen (Tylenol), Serum <10 (*)    All other components within normal limits  CBG MONITORING, ED - Abnormal; Notable for the following components:   Glucose-Capillary 147 (*)    All other components within normal limits  I-STAT CG4 LACTIC ACID, ED - Abnormal; Notable for the following components:   Lactic Acid, Venous 5.5 (*)    All other components within normal limits  I-STAT CHEM 8, ED - Abnormal; Notable for the following components:   Potassium 2.8 (*)    Glucose, Bld 139 (*)    Calcium, Ion 1.09 (*)    TCO2 21 (*)    All other components within normal limits  I-STAT CG4 LACTIC ACID, ED - Abnormal; Notable for the following components:   Lactic Acid, Venous 4.0 (*)    All other components within normal limits  I-STAT VENOUS BLOOD GAS, ED - Abnormal; Notable for the following components:   pH, Ven 7.475 (*)    pCO2, Ven 31.1 (*)    Potassium 2.8 (*)  Calcium, Ion 1.10 (*)    All other components within normal limits  I-STAT VENOUS BLOOD GAS, ED - Abnormal; Notable for the following components:   pH, Ven 7.495 (*)    pCO2, Ven 29.6 (*)    Potassium 3.3 (*)    Calcium, Ion 1.14 (*)    All other components within normal limits  I-STAT CHEM 8, ED - Abnormal; Notable for the following components:   Potassium 3.4 (*)    Glucose, Bld 116 (*)    Calcium, Ion 1.14 (*)    TCO2 20 (*)    All other components within normal limits  CULTURE, BLOOD (SINGLE)  CBC  LIPASE, BLOOD  CK  BETA-HYDROXYBUTYRIC ACID  ETHANOL  I-STAT CG4 LACTIC ACID, ED  TROPONIN I (HIGH SENSITIVITY)  TROPONIN I (HIGH SENSITIVITY)    US Abdomen Limited RUQ (LIVER/GB)  Final Result     CT ABDOMEN PELVIS W CONTRAST  Final Result    DG Chest Port 1 View  Final Result      Medications  lactated ringers infusion (has no administration in time range)  lactated ringers bolus 1,000 mL (0 mLs Intravenous Stopped 07/29/23 0647)    And  lactated ringers bolus 1,000 mL (1,000 mLs Intravenous New Bag/Given 07/29/23 0647)    And  lactated ringers bolus 1,000 mL (has no administration in time range)    And  lactated ringers bolus 500 mL (has no administration in time range)  vancomycin (VANCOCIN) 2,500 mg in sodium chloride 0.9 % 500 mL IVPB (2,500 mg Intravenous New Bag/Given 07/29/23 0621)  LORazepam (ATIVAN) injection 0-4 mg ( Intravenous See Alternative 07/29/23 0531)    Or  LORazepam (ATIVAN) tablet 0-4 mg (0 mg Oral Hold 07/29/23 0531)  LORazepam (ATIVAN) injection 0-4 mg (has no administration in time range)    Or  LORazepam (ATIVAN) tablet 0-4 mg (has no administration in time range)  thiamine (VITAMIN B1) tablet 100 mg (has no administration in time range)    Or  thiamine (VITAMIN B1) injection 100 mg (has no administration in time range)  lactated ringers bolus 1,000 mL (0 mLs Intravenous Stopped 07/29/23 0509)  ondansetron (ZOFRAN) injection 4 mg (4 mg Intravenous Given 07/29/23 0343)  ceFEPIme (MAXIPIME) 2 g in sodium chloride 0.9 % 100 mL IVPB (0 g Intravenous Stopped 07/29/23 0509)  metroNIDAZOLE (FLAGYL) IVPB 500 mg (0 mg Intravenous Stopped 07/29/23 0619)  HYDROmorphone (DILAUDID) injection 0.5 mg (0.5 mg Intravenous Given 07/29/23 0345)  HYDROmorphone (DILAUDID) injection 0.5 mg (0.5 mg Intravenous Given 07/29/23 0417)  iohexol (OMNIPAQUE) 350 MG/ML injection 75 mL (75 mLs Intravenous Contrast Given 07/29/23 0403)  HYDROmorphone (DILAUDID) injection 1 mg (1 mg Intravenous Given 07/29/23 9629)     Procedures  /  Critical Care .Critical Care  Performed by: Roxy Horseman, PA-C Authorized by: Roxy Horseman, PA-C   Critical care provider statement:     Critical care time (minutes):  57   Critical care was necessary to treat or prevent imminent or life-threatening deterioration of the following conditions:  Metabolic crisis   Critical care was time spent personally by me on the following activities:  Development of treatment plan with patient or surrogate, discussions with consultants, evaluation of patient's response to treatment, examination of patient, ordering and review of laboratory studies, ordering and review of radiographic studies, ordering and performing treatments and interventions, pulse oximetry, re-evaluation of patient's condition and review of old charts   ED Course and Medical Decision Making  I have reviewed the triage  vital signs, the nursing notes, and pertinent available records from the EMR.  Social Determinants Affecting Complexity of Care: Patient has no clinically significant social determinants affecting this chief complaint..   ED Course:    Medical Decision Making Patient here with abdominal pain and vomiting.  I saw the patient up in triage and noted him to be tachypneic and hyperventilating.  His lactic acid in triage was 5.5.  Patient started on broad-spectrum antibiotics and weight-based fluids for presumed sepsis.  He was afebrile.  He was never hypotensive.  No leukocytosis.  Anion gap is 23.  Venous pH is normal.  Patient seen by and discussed with Dr. Madilyn Hook, who agrees with plan.  CT abdomen/pelvis notable for possible cholecystitis.  Will check right upper quadrant ultrasound.  Right upper quadrant ultrasound notable for a calculus cholecystitis.  Will consult general surgery.  Amount and/or Complexity of Data Reviewed Labs: ordered. Radiology: ordered and independent interpretation performed.    Details: No gallstones present, but gallbladder wall is thick Concern for acalculous cholecystitis  Risk OTC drugs. Prescription drug management. Decision regarding hospitalization.          Consultants: I consulted with Dr. Sheliah Hatch, who will consult.   Treatment and Plan: Patient's exam and diagnostic results are concerning for cholecystitis.  Feel that patient will need admission to the hospital for further treatment and evaluation.    Final Clinical Impressions(s) / ED Diagnoses     ICD-10-CM   1. Acalculous cholecystitis  K81.9       ED Discharge Orders     None         Discharge Instructions Discussed with and Provided to Patient:   Discharge Instructions   None      Roxy Horseman, PA-C 07/29/23 1324    Tilden Fossa, MD 07/29/23 [phone]

## 2023-07-29 NOTE — Progress Notes (Signed)
ED Pharmacy Antibiotic Sign Off An antibiotic consult was received from an ED provider for cefepime and vancomycin per pharmacy dosing for sepsis. A chart review was completed to assess appropriateness.   The following one time order(s) were placed:   -Cefepime 2g IV x1 per MD -Vancomycin 2.5g IV x1  Further antibiotic and/or antibiotic pharmacy consults should be ordered by the admitting provider if indicated.   Thank you for allowing pharmacy to be a part of this patient's care.   Arabella Merles, [facility]  Clinical Pharmacist 07/29/23 3:39 AM

## 2023-07-29 NOTE — Transfer of Care (Signed)
Immediate Anesthesia Transfer of Care Note  Patient: Aymaan Gunsallus  Procedure(s) Performed: LAPAROSCOPIC CHOLECYSTECTOMY (Abdomen) INDOCYANINE GREEN FLUORESCENCE IMAGING (ICG) (Abdomen)  Patient Location: PACU  Anesthesia Type:General  Level of Consciousness: awake, oriented, and sedated  Airway & Oxygen Therapy: Patient Spontanous Breathing and Patient connected to face mask oxygen  Post-op Assessment: Report given to RN and Post -op Vital signs reviewed and stable  Post vital signs: Reviewed and stable  Last Vitals:  Vitals Value Taken Time  BP 133/78 07/29/23 1327  Temp 97.5   Pulse 71 07/29/23 1330  Resp 17 07/29/23 1330  SpO2 100 % 07/29/23 1330  Vitals shown include unfiled device data.  Last Pain:  Vitals:   07/29/23 1103  TempSrc:   PainSc: 6          Complications: No notable events documented.

## 2023-07-29 NOTE — Op Note (Signed)
07/29/2023  1:08 PM  PATIENT:  Gavin Young  42 y.o. male  PRE-OPERATIVE DIAGNOSIS:  cholecystitis  POST-OPERATIVE DIAGNOSIS:  Acute cholecystitis  PROCEDURE:  Procedure(s): LAPAROSCOPIC CHOLECYSTECTOMY (N/A)  SURGEON:  Surgeons and Role:    Axel Filler, MD - Primary  ASSISTANTS: Rockwell Germany, RNFA   ANESTHESIA:   local and general  EBL:  10 mL   BLOOD ADMINISTERED:none  DRAINS: none   LOCAL MEDICATIONS USED:  BUPIVICAINE   SPECIMEN:  Source of Specimen:  gallbladder  DISPOSITION OF SPECIMEN:  PATHOLOGY  COUNTS:  YES  TOURNIQUET:  * No tourniquets in log *  DICTATION: .Dragon Dictation The patient was taken to the operating and placed in the supine position with bilateral SCDs in place.  The patient was prepped and draped in the usual sterile fashion. A time out was called and all facts were verified. A pneumoperitoneum was obtained via A Veress needle technique to a pressure of 14mm of mercury.  A 5mm trochar was then placed in the right upper quadrant under visualization, and there were no injuries to any abdominal organs. A 11 mm port was then placed in the umbilical region after infiltrating with local anesthesia under direct visualization. A second and third epigastric port and right lower quadrant port placement under direct visualization, respectively.    The gallbladder was identified.  It was very edematous.  It was suctioned our and retracted, the peritoneum was then sharply dissected from the gallbladder and this dissection was carried down to Calot's triangle. The cystic duct was identified and stripped away circumferentially and seen going into the gallbladder 360, the critical angle was obtained.  2 clips were placed proximally one distally and the cystic duct transected. The cystic artery was identified and 2 clips placed proximally and one distally and transected.  An Endoloop was placed at the base of the clips.  We then proceeded to remove the  gallbladder off the hepatic fossa with Bovie cautery. A retrieval bag was then placed in the abdomen and gallbladder placed in the bag. The hepatic fossa was then reexamined and hemostasis was achieved with Bovie cautery and was excellent at the end of the case.   The subhepatic fossa and perihepatic fossa was then irrigated until the effluent was clear.  The gallbladder and bag were removed from the abdominal cavity. The 11 mm trocar fascia was reapproximated with the Endo Close #1 Vicryl x2.  The pneumoperitoneum was evacuated and all trochars removed under direct visulalization.  The skin was then closed with 4-0 Monocryl and the skin dressed with Dermabond.    The patient was awaken from general anesthesia and taken to the recovery room in stable condition.    PLAN OF CARE: Discharge to home after PACU  PATIENT DISPOSITION:  PACU - hemodynamically stable.   Delay start of Pharmacological VTE agent (>24hrs) due to surgical blood loss or risk of bleeding: not applicable

## 2023-07-29 NOTE — ED Provider Notes (Signed)
Care assumed from Roxy Horseman, PA-C at shift change pending admission.  See his note for full HPI.  In short, patient is a 42 year old male who presents to the ED due to abdominal pain associated with nausea and vomiting.  CT abdomen concerning for possible acute cholecystitis.  Right upper quadrant ultrasound notable for acalculous cholecystitis.  At shift change, general surgery to see for possible admission vs. Medicine admission.   ED Course / MDM    Medical Decision Making Amount and/or Complexity of Data Reviewed Labs: ordered. Radiology: ordered.  Risk OTC drugs. Prescription drug management. Decision regarding hospitalization.   8:44 AM patient admitted to general surgery team for cholecystectomy.         Jesusita Oka 07/29/23 1610    Tilden Fossa, MD 07/30/23 1012

## 2023-07-29 NOTE — Sepsis Progress Note (Signed)
Elink monitoring for the code sepsis protocol.  

## 2023-07-30 ENCOUNTER — Encounter (HOSPITAL_COMMUNITY): Payer: Self-pay | Admitting: General Surgery

## 2023-07-31 LAB — SURGICAL PATHOLOGY

## 2023-08-03 LAB — CULTURE, BLOOD (SINGLE)
Culture: NO GROWTH
Special Requests: ADEQUATE
